# Patient Record
Sex: Male | Born: 1960
Health system: Southern US, Community
[De-identification: ages and names within clinical notes are randomized; demographics above are authoritative.]

## PROBLEM LIST (undated history)

## (undated) DIAGNOSIS — Z0000 Encounter for general adult medical examination without abnormal findings: Secondary | ICD-10-CM

## (undated) DIAGNOSIS — I10 Essential (primary) hypertension: Secondary | ICD-10-CM

## (undated) DIAGNOSIS — I82409 Acute embolism and thrombosis of unspecified deep veins of unspecified lower extremity: Secondary | ICD-10-CM

## (undated) DIAGNOSIS — R002 Palpitations: Secondary | ICD-10-CM

## (undated) DIAGNOSIS — E119 Type 2 diabetes mellitus without complications: Secondary | ICD-10-CM

## (undated) DIAGNOSIS — G4730 Sleep apnea, unspecified: Secondary | ICD-10-CM

## (undated) DIAGNOSIS — E785 Hyperlipidemia, unspecified: Secondary | ICD-10-CM

## (undated) DIAGNOSIS — E669 Obesity, unspecified: Secondary | ICD-10-CM

## (undated) HISTORY — DX: Hyperlipidemia, unspecified: E78.5

## (undated) HISTORY — DX: Encounter for general adult medical examination without abnormal findings: Z00.00

## (undated) HISTORY — DX: Sleep apnea, unspecified: G47.30

## (undated) HISTORY — PX: TONSILLECTOMY AND ADENOIDECTOMY: SUR1326

## (undated) HISTORY — DX: Type 2 diabetes mellitus without complications: E11.9

## (undated) HISTORY — DX: Acute embolism and thrombosis of unspecified deep veins of unspecified lower extremity: I82.409

## (undated) HISTORY — DX: Essential (primary) hypertension: I10

## (undated) HISTORY — DX: Obesity, unspecified: E66.9

## (undated) HISTORY — DX: Palpitations: R00.2

## (undated) HISTORY — PX: OTHER SURGICAL HISTORY: SHX169

---

## 2009-04-22 DEATH — deceased

## 2014-07-03 ENCOUNTER — Encounter: Payer: Self-pay | Admitting: *Deleted

## 2014-07-07 ENCOUNTER — Ambulatory Visit (INDEPENDENT_AMBULATORY_CARE_PROVIDER_SITE_OTHER): Payer: 59 | Admitting: Cardiology

## 2014-07-07 ENCOUNTER — Encounter: Payer: Self-pay | Admitting: Cardiology

## 2014-07-07 VITALS — BP 147/87 | Ht 72.0 in | Wt 318.0 lb

## 2014-07-07 DIAGNOSIS — R002 Palpitations: Secondary | ICD-10-CM

## 2014-07-07 DIAGNOSIS — I82409 Acute embolism and thrombosis of unspecified deep veins of unspecified lower extremity: Secondary | ICD-10-CM | POA: Insufficient documentation

## 2014-07-07 DIAGNOSIS — Z86718 Personal history of other venous thrombosis and embolism: Secondary | ICD-10-CM

## 2014-07-07 DIAGNOSIS — R0602 Shortness of breath: Secondary | ICD-10-CM

## 2014-07-07 DIAGNOSIS — O2230 Deep phlebothrombosis in pregnancy, unspecified trimester: Secondary | ICD-10-CM

## 2014-07-07 NOTE — Patient Instructions (Addendum)
Your physician recommends that you schedule a follow-up appointment in: one year with Dr. Percival Spanish  We are ordering a Calcium score  We are ordering a stress test

## 2014-07-07 NOTE — Progress Notes (Signed)
HPI The patient presents for evaluation of palpitations. He has a history of DVT and pulmonary emboli and has been on chronic anticoagulation therapy. This happened when he lived in [place] and I don't have these records. However, fairly recently he had some increased palpitations. This seemed to have been with after he hasn't caffeine including some energy drinks. He felt his heart skipping one evening and then through the day. He did go to see his primary care doctor and there is mention of ectopic beats. However, he denies any ongoing symptoms since resolved. He does otherwise have rare palpitations but no presyncope or syncope. He denies any chest pressure, neck or arm discomfort. He denies any shortness of breath, PND or orthopnea. He has started doing some exercise very recently just had one session and plans to take this up.  Not on File  Current Outpatient Prescriptions  Medication Sig Dispense Refill  . atorvastatin (LIPITOR) 20 MG tablet Take 20 mg by mouth daily.      Marland Kitchen lisinopril (PRINIVIL,ZESTRIL) 20 MG tablet Take 20 mg by mouth daily.      . metformin (FORTAMET) 1000 MG (OSM) 24 hr tablet Take 1,000 mg by mouth daily with breakfast.      . metoprolol succinate (TOPROL-XL) 25 MG 24 hr tablet Take 25 mg by mouth daily.      . rivaroxaban (XARELTO) 20 MG TABS tablet Take 20 mg by mouth daily with supper.       No current facility-administered medications for this visit.    Past Medical History  Diagnosis Date  . DVT (deep vein thrombosis) in pregnancy   . PE (physical exam), annual   . Palpitation   . Sleep apnea     CPAP  . Hyperlipidemia   . HTN (hypertension)   . Obesity   . Diabetes     Past Surgical History  Procedure Laterality Date  . Tonsillectomy and adenoidectomy    . Athroscopic      Knee (right)    Family History  Problem Relation Age of Onset  . Clotting disorder Father   . Hypertension Mother     History   Social History  . Marital  Status: Married    Spouse Name: N/A    Number of Children: 6  . Years of Education: N/A   Occupational History  . Not on file.   Social History Main Topics  . Smoking status: Former Smoker -- 0.50 packs/day for 10 years    Types: Cigarettes  . Smokeless tobacco: Former Systems developer    Quit date: 06/22/2013  . Alcohol Use: Not on file  . Drug Use: Not on file  . Sexual Activity: Not on file   Other Topics Concern  . 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      ROS:  As stated in the HPI and negative for all other systems.  PHYSICAL EXAM BP 147/87  Ht 6' (1.829 m)  Wt 318 lb (144.244 kg)  BMI 43.12 kg/m2 GENERAL:  Well appearing HEENT:  Pupils equal round and reactive, fundi not visualized, oral mucosa unremarkable NECK:  No jugular venous distention, waveform within normal limits, carotid upstroke brisk and symmetric, no bruits, no thyromegaly LYMPHATICS:  No cervical, inguinal adenopathy LUNGS:  Clear to auscultation bilaterally BACK:  No CVA tenderness CHEST:  Unremarkable HEART:  PMI not displaced or sustained,S1 and S2 within normal limits, no S3, no  S4, no clicks, no rubs, no murmurs ABD:  Flat, positive bowel sounds normal in frequency in pitch, no bruits, no rebound, no guarding, no midline pulsatile mass, no hepatomegaly, no splenomegaly EXT:  2 plus pulses throughout, no edema, no cyanosis no clubbing SKIN:  No rashes no nodules NEURO:  Cranial nerves II through XII grossly intact, motor grossly intact throughout PSYCH:  Cognitively intact, oriented to person place and time   EKG:  Sinus rhythm, rate 72, axis within normal limits, intervals within normal limits, no acute ST-T wave changes.  07/07/2014   ASSESSMENT AND PLAN  PALPITATIONS:   These are no longer problematic. There was some mention of premature atrial contractions. However, his severe events were probably related to excessive caffeine. No change in  therapy or further evaluation specifically for this is indicated unless he has recurrent symptoms.  SCREENING:  With a long discussion about his risk of cardiovascular disease and multiple cardiovascular risk factors. He's about to start on an exercise regimen. Given this and his diabetes I do plan on screening him.  I will bring the patient back for a POET (Plain Old Exercise Test). This will allow me to screen for obstructive coronary disease, risk stratify and very importantly provide a prescription for exercise.  I have also suggested a coronary calcium score.  OBESITY:  The patient understands the need to lose weight with diet and exercise. We have discussed specific strategies for this.  DM:  Per  Melinda Crutch, MD  HYPERLIPIDEMIA:  His LDL was near 70 although his HDL was low and triglycerides elevated. I rviewed these results. For now he will continue on the meds as listed.  However, we might pursue more aggressive screening and treatment based on the results of his above testing.  CHRONIC ANTICOAGULATION:  I discussed this with him and he'll have the details around his clotting events. I don't note he's ever had a hypercoagulable workup. I would be happy to review these data suggest to get the records from [place].

## 2014-07-13 ENCOUNTER — Ambulatory Visit (INDEPENDENT_AMBULATORY_CARE_PROVIDER_SITE_OTHER)
Admission: RE | Admit: 2014-07-13 | Discharge: 2014-07-13 | Disposition: A | Discharge status: Home / Self Care | Payer: Self-pay | Source: Ambulatory Visit | Attending: Cardiology | Admitting: Cardiology

## 2014-07-13 DIAGNOSIS — O2230 Deep phlebothrombosis in pregnancy, unspecified trimester: Secondary | ICD-10-CM

## 2014-07-13 DIAGNOSIS — R0602 Shortness of breath: Secondary | ICD-10-CM

## 2014-07-13 DIAGNOSIS — R002 Palpitations: Secondary | ICD-10-CM

## 2014-07-17 ENCOUNTER — Ambulatory Visit: Payer: Self-pay | Admitting: Cardiology

## 2014-08-02 ENCOUNTER — Telehealth (HOSPITAL_COMMUNITY): Payer: Self-pay

## 2014-08-02 NOTE — Telephone Encounter (Signed)
Encounter complete. 

## 2014-08-04 ENCOUNTER — Ambulatory Visit (HOSPITAL_COMMUNITY)
Admission: RE | Admit: 2014-08-04 | Discharge: 2014-08-04 | Disposition: A | Discharge status: Home / Self Care | Payer: 59 | Source: Ambulatory Visit | Attending: Internal Medicine | Admitting: Internal Medicine

## 2014-08-04 DIAGNOSIS — R0602 Shortness of breath: Secondary | ICD-10-CM | POA: Diagnosis not present

## 2014-08-04 DIAGNOSIS — O2230 Deep phlebothrombosis in pregnancy, unspecified trimester: Secondary | ICD-10-CM

## 2014-08-04 DIAGNOSIS — R002 Palpitations: Secondary | ICD-10-CM | POA: Diagnosis present

## 2014-08-04 NOTE — Procedures (Signed)
Exercise Treadmill Test Test  Exercise Tolerance Test Ordering MD: Marijo File, MD  Interpreting MD:   Unique Test No:1  Treadmill:  1  Indication for ETT: dyspnea, palpitations Contraindication to ETT: No   Stress Modality: exercise - treadmill  Cardiac Imaging Performed: non   Protocol: standard Bruce - maximal  Max BP:  214/66  Max MPHR (bpm):  168 85% MPR (bpm): 142  MPHR obtained (bpm): 164 % MPHR obtained:  97  Reached 85% MPHR (min:sec):  4:55 Total Exercise Time (min-sec):  6:47  Workload in METS: 8.20 Borg Scale:   Reason ETT Terminated:  Dyspnea, fatigue    ST Segment Analysis At Rest: NSR, nonspecific ST changes With Exercise: no evidence of significant ST depression  Other Information Arrhythmia:  Rare PVC Angina during ETT:  absent (0) Quality of ETT:  diagnostic  ETT Interpretation:  normal - no evidence of ischemia by ST analysis  Comments: ETT with fair exercise tolerance (6:47); Normal BP response; no chest pain; no diagnostic ST changes; negative adequate ETT.   Kirk Ruths

## 2015-07-11 ENCOUNTER — Ambulatory Visit: Payer: Self-pay | Admitting: Physician Assistant

## 2015-07-11 DIAGNOSIS — R7989 Other specified abnormal findings of blood chemistry: Secondary | ICD-10-CM

## 2015-07-11 MED ORDER — TESTOSTERONE CYPIONATE 200 MG/ML IM SOLN
200.0000 mg | INTRAMUSCULAR | Status: AC
  Administered 2015-07-11: 200 mg via INTRAMUSCULAR

## 2015-07-11 NOTE — Progress Notes (Signed)
Here for regularly scheduled testosterone injection

## 2015-10-16 MED FILL — LISINOPRIL 20 MG TABLET: 20 | 90 days supply | Qty: 90 | Fill #3

## 2015-10-16 MED FILL — metFORMIN HCL 1000 MG TABS: 1000 | 90 days supply | Qty: 90 | Fill #3

## 2015-10-16 MED FILL — METOPROLOL SUCC ER 25 MG TA: 25 | 90 days supply | Qty: 90 | Fill #3

## 2015-10-16 MED FILL — XARELTO 20 MG TABLET: 20 | 90 days supply | Qty: 90 | Fill #3

## 2015-10-16 MED FILL — ATORVASTATIN 20 MG TABLET: 20 | 90 days supply | Qty: 90 | Fill #3

## 2015-10-25 DIAGNOSIS — G4733 Obstructive sleep apnea (adult) (pediatric): Secondary | ICD-10-CM | POA: Diagnosis not present

## 2015-12-20 DIAGNOSIS — E7800 Pure hypercholesterolemia, unspecified: Secondary | ICD-10-CM | POA: Diagnosis not present

## 2015-12-20 DIAGNOSIS — Z0000 Encounter for general adult medical examination without abnormal findings: Secondary | ICD-10-CM | POA: Diagnosis not present

## 2015-12-20 DIAGNOSIS — E119 Type 2 diabetes mellitus without complications: Secondary | ICD-10-CM | POA: Diagnosis not present

## 2015-12-20 DIAGNOSIS — I10 Essential (primary) hypertension: Secondary | ICD-10-CM | POA: Diagnosis not present

## 2015-12-20 DIAGNOSIS — Z86711 Personal history of pulmonary embolism: Secondary | ICD-10-CM | POA: Diagnosis not present

## 2015-12-20 DIAGNOSIS — Z23 Encounter for immunization: Secondary | ICD-10-CM | POA: Diagnosis not present

## 2015-12-20 MED FILL — SILDENAFIL 20 MG TABLET: 20 | 10 days supply | Qty: 50 | Fill #0

## 2016-01-10 DIAGNOSIS — R05 Cough: Secondary | ICD-10-CM | POA: Diagnosis not present

## 2016-01-10 MED FILL — VENTOLIN HFA 90 MCG INHALER: 108 (90 BAS | 17 days supply | Qty: 18 | Fill #0

## 2016-01-10 MED FILL — PROMETHAZINE-CODEINE SYRUP: 6.25-10 | 5 days supply | Qty: 100 | Fill #0

## 2016-01-10 MED FILL — BENZONATATE 100 MG CAPSULE: 100 | 10 days supply | Qty: 30 | Fill #0

## 2016-01-21 MED FILL — METOPROLOL SUCC ER 25 MG TA: 25 | 90 days supply | Qty: 90 | Fill #0

## 2016-01-21 MED FILL — metFORMIN HCL 1000 MG TABS: 1000 | 90 days supply | Qty: 90 | Fill #0

## 2016-01-21 MED FILL — XARELTO 20 MG TABLET: 20 | 90 days supply | Qty: 90 | Fill #0

## 2016-01-21 MED FILL — ATORVASTATIN 20 MG TABLET: 20 | 90 days supply | Qty: 90 | Fill #0

## 2016-01-21 MED FILL — LISINOPRIL 20 MG TABLET: 20 | 90 days supply | Qty: 90 | Fill #0

## 2016-03-13 DIAGNOSIS — G4733 Obstructive sleep apnea (adult) (pediatric): Secondary | ICD-10-CM | POA: Diagnosis not present

## 2016-04-09 DIAGNOSIS — L237 Allergic contact dermatitis due to plants, except food: Secondary | ICD-10-CM | POA: Diagnosis not present

## 2016-04-09 MED FILL — predniSONE 10 MG TABS: 10 | 6 days supply | Qty: 21 | Fill #0

## 2016-04-22 MED FILL — metFORMIN HCL 1000 MG TABS: 1000 | 90 days supply | Qty: 90 | Fill #1

## 2016-04-22 MED FILL — METOPROLOL SUCC ER 25 MG TA: 25 | 90 days supply | Qty: 90 | Fill #1

## 2016-04-22 MED FILL — ATORVASTATIN 20 MG TABLET: 20 | 90 days supply | Qty: 90 | Fill #1

## 2016-04-22 MED FILL — XARELTO 20 MG TABLET: 20 | 90 days supply | Qty: 90 | Fill #1

## 2016-04-22 MED FILL — LISINOPRIL 20 MG TABLET: 20 | 90 days supply | Qty: 90 | Fill #1

## 2016-04-24 DIAGNOSIS — G4733 Obstructive sleep apnea (adult) (pediatric): Secondary | ICD-10-CM | POA: Diagnosis not present

## 2016-06-24 DIAGNOSIS — Z7984 Long term (current) use of oral hypoglycemic drugs: Secondary | ICD-10-CM | POA: Diagnosis not present

## 2016-06-24 DIAGNOSIS — Z6841 Body Mass Index (BMI) 40.0 and over, adult: Secondary | ICD-10-CM | POA: Diagnosis not present

## 2016-06-24 DIAGNOSIS — E119 Type 2 diabetes mellitus without complications: Secondary | ICD-10-CM | POA: Diagnosis not present

## 2016-06-24 DIAGNOSIS — Z23 Encounter for immunization: Secondary | ICD-10-CM | POA: Diagnosis not present

## 2016-07-23 MED FILL — LISINOPRIL 20 MG TABLET: 20 | 90 days supply | Qty: 90 | Fill #2

## 2016-07-23 MED FILL — metFORMIN HCL 1000 MG TABS: 1000 | 90 days supply | Qty: 90 | Fill #2

## 2016-07-23 MED FILL — XARELTO 20 MG TABLET: 20 | 90 days supply | Qty: 90 | Fill #2

## 2016-07-23 MED FILL — METOPROLOL SUCC ER 25 MG TA: 25 | 90 days supply | Qty: 90 | Fill #2

## 2016-07-23 MED FILL — ATORVASTATIN 20 MG TABLET: 20 | 90 days supply | Qty: 90 | Fill #2

## 2016-08-04 DIAGNOSIS — G4733 Obstructive sleep apnea (adult) (pediatric): Secondary | ICD-10-CM | POA: Diagnosis not present

## 2016-10-24 MED FILL — METOPROLOL SUCC ER 25 MG TA: 25 | 90 days supply | Qty: 90 | Fill #3

## 2016-10-24 MED FILL — XARELTO 20 MG TABLET: 20 | 90 days supply | Qty: 90 | Fill #3

## 2016-10-24 MED FILL — LISINOPRIL 20 MG TABLET: 20 | 90 days supply | Qty: 90 | Fill #3

## 2016-10-24 MED FILL — metFORMIN HCL 1000 MG TABS: 1000 | 90 days supply | Qty: 90 | Fill #3

## 2016-10-24 MED FILL — ATORVASTATIN 20 MG TABLET: 20 | 90 days supply | Qty: 90 | Fill #3

## 2016-10-24 MED FILL — SILDENAFIL 20 MG TABLET: 20 | 6 days supply | Qty: 30 | Fill #1

## 2016-12-16 MED FILL — CLINDAMYCIN HCL 150 MG CAPS: 150 | 7 days supply | Qty: 28 | Fill #0

## 2016-12-25 DIAGNOSIS — E119 Type 2 diabetes mellitus without complications: Secondary | ICD-10-CM | POA: Diagnosis not present

## 2016-12-25 DIAGNOSIS — Z7984 Long term (current) use of oral hypoglycemic drugs: Secondary | ICD-10-CM | POA: Diagnosis not present

## 2016-12-25 DIAGNOSIS — M7710 Lateral epicondylitis, unspecified elbow: Secondary | ICD-10-CM | POA: Diagnosis not present

## 2016-12-25 DIAGNOSIS — I10 Essential (primary) hypertension: Secondary | ICD-10-CM | POA: Diagnosis not present

## 2017-01-26 MED FILL — XARELTO 20 MG TABLET: 20 | 90 days supply | Qty: 90 | Fill #0

## 2017-01-26 MED FILL — ATORVASTATIN 20 MG TABLET: 20 | 90 days supply | Qty: 90 | Fill #0

## 2017-01-26 MED FILL — LISINOPRIL 20 MG TABLET: 20 | 90 days supply | Qty: 90 | Fill #0

## 2017-01-26 MED FILL — METOPROLOL SUCC ER 25 MG TA: 25 | 90 days supply | Qty: 90 | Fill #0

## 2017-01-26 MED FILL — metFORMIN HCL 1000 MG TABS: 1000 | 90 days supply | Qty: 90 | Fill #0

## 2017-03-13 DIAGNOSIS — H52221 Regular astigmatism, right eye: Secondary | ICD-10-CM | POA: Diagnosis not present

## 2017-03-13 DIAGNOSIS — H524 Presbyopia: Secondary | ICD-10-CM | POA: Diagnosis not present

## 2017-03-13 DIAGNOSIS — H5213 Myopia, bilateral: Secondary | ICD-10-CM | POA: Diagnosis not present

## 2017-03-13 DIAGNOSIS — Z7984 Long term (current) use of oral hypoglycemic drugs: Secondary | ICD-10-CM | POA: Diagnosis not present

## 2017-03-13 DIAGNOSIS — E119 Type 2 diabetes mellitus without complications: Secondary | ICD-10-CM | POA: Diagnosis not present

## 2017-04-15 DIAGNOSIS — G4733 Obstructive sleep apnea (adult) (pediatric): Secondary | ICD-10-CM | POA: Diagnosis not present

## 2017-04-21 MED FILL — FLUTICASONE PROP 0.05% CRM: 0.05 | 30 days supply | Qty: 30 | Fill #0

## 2017-04-21 MED FILL — metFORMIN HCL 1000 MG TABS: 1000 | 90 days supply | Qty: 90 | Fill #1

## 2017-04-21 MED FILL — METOPROLOL SUCC ER 25 MG TA: 25 | 90 days supply | Qty: 90 | Fill #0

## 2017-04-21 MED FILL — SILDENAFIL 20 MG TABLET: 20 | 10 days supply | Qty: 50 | Fill #0

## 2017-04-21 MED FILL — ATORVASTATIN 20 MG TABLET: 20 | 90 days supply | Qty: 90 | Fill #0

## 2017-04-21 MED FILL — XARELTO 20 MG TABLET: 20 | 90 days supply | Qty: 90 | Fill #0

## 2017-04-21 MED FILL — LISINOPRIL 20 MG TAB: 20 | 90 days supply | Qty: 90 | Fill #1

## 2017-04-23 ENCOUNTER — Telehealth: Payer: Self-pay | Admitting: Cardiology

## 2017-04-23 NOTE — Telephone Encounter (Signed)
New message       Pt c/o Shortness Of Breath: STAT if SOB developed within the last 24 hours or pt is noticeably SOB on the phone  1. Are you currently SOB (can you hear that pt is SOB on the phone)?  no 2. How long have you been experiencing SOB?  approx 1 month 3. Are you SOB when sitting or when up moving around?  Moving around  4. Are you currently experiencing any other symptoms?  Irregular heart beat, extreme fatigue and tingling in extremities.  Appt was made on 05-05-17 with Almyra Deforest ( no appts available in care group) but, because of symptoms, please call pt and see if he needs to be seen sooner.  He does not feel good.

## 2017-04-23 NOTE — Telephone Encounter (Signed)
Returned call to patient.Stated for the past 1 month he tires easy,sob with least exertion,frequent palpitations.Stated he has appointment with Dr.Hochrein 05/05/17, but wife who is a cardiac nurse at [employer] thinks he needs to be seen sooner.Stated he wants to see Dr.Hochrein.Advised Dr.Hochrein's schedule is full.I will send message to him for advice.

## 2017-04-26 NOTE — Telephone Encounter (Signed)
I could see this patient on Tuesday afternoon.

## 2017-04-27 NOTE — Telephone Encounter (Signed)
appt schedule for 08/07 @2 :00 pm

## 2017-04-27 NOTE — Progress Notes (Signed)
Cardiology Office Note   Date:  04/29/2017   ID:  Ronald Woodward, DOB December 24, 1960, MRN [id number]  PCP:  [name], MD  Cardiologist:   Minus Breeding, MD    Chief Complaint  Patient presents with  . Palpitations  . Shortness of Breath      History of Present Illness: Ronald Woodward is a 56 y.o. male who presents for evaluation of SOB, palpitations and fatigue.  I saw him in 2015.  he had some similar symptoms and had a zero calcium score and negative POET (Plain Old Exercise Treadmill)   He says about a month ago he started having increased palpitations. There was one day when he had about 10 of these in a span of 5 minutes. He describes what sounds like more isolated skipped beats similar to PVCs he's had in the past. He has had some episodes of almost feeling like he was going to pass out with this. He's having these episodes times a week though not as severe typically. He's had more shortness of breath with activity such as walking a moderate distance on level ground. He's had some dyspnea walking up hills. He's not having any chest pressure, neck or arm discomfort. He's not having any resting PND or orthopnea. He uses his CPAP. He got some of the symptoms were similar to prior to his DVT and coronary embolism although he's been on chronic anticoagulation. He's had no leg swelling.   Past Medical History:  Diagnosis Date  . Diabetes ([place])   . DVT (deep venous thrombosis) ([place])   . HTN (hypertension)   . Hyperlipidemia   . Obesity   . Palpitation   . PE (physical exam), annual   . Sleep apnea    CPAP    Past Surgical History:  Procedure Laterality Date  . Athroscopic     Knee (right)  . TONSILLECTOMY AND ADENOIDECTOMY       Current Outpatient Prescriptions  Medication Sig Dispense Refill  . atorvastatin (LIPITOR) 20 MG tablet Take 20 mg by mouth daily.    Marland Kitchen lisinopril (PRINIVIL,ZESTRIL) 20 MG tablet Take 20 mg by mouth daily.    . metformin (FORTAMET)  1000 MG (OSM) 24 hr tablet Take 1,000 mg by mouth daily with breakfast.    . metoprolol succinate (TOPROL-XL) 50 MG 24 hr tablet Take 1 tablet (50 mg total) by mouth daily. 90 tablet 3  . rivaroxaban (XARELTO) 20 MG TABS tablet Take 20 mg by mouth daily with supper.    . chlorthalidone (HYGROTON) 25 MG tablet Take 1 tablet (25 mg total) by mouth daily. 90 tablet 3   Current Facility-Administered Medications  Medication Dose Route Frequency Provider Last Rate Last Dose  . testosterone cypionate (DEPOTESTOSTERONE CYPIONATE) injection 200 mg  200 mg Intramuscular Q14 Days Versie Starks, PA-C   200 mg at 07/11/15 1206    Allergies:   Patient has no allergy information on record.     ROS:  Please see the history of present illness.   Otherwise, review of systems are positive for none.   All other systems are reviewed and negative.    PHYSICAL EXAM: VS:  BP (!) 153/85   Pulse 74   Ht 6\' 1"  (1.854 m)   Wt (!) 317 lb (143.8 kg)   BMI 41.82 kg/m  , BMI Body mass index is 41.82 kg/m. GENERAL:  Well appearing HEENT:  Pupils equal round and reactive, fundi not visualized, oral mucosa unremarkable NECK:  No jugular venous distention, waveform within normal limits, carotid upstroke brisk and symmetric, no bruits, no thyromegaly LYMPHATICS:  No cervical, inguinal adenopathy LUNGS:  Clear to auscultation bilaterally BACK:  No CVA tenderness CHEST:  Unremarkable HEART:  PMI not displaced or sustained,S1 and S2 within normal limits, no S3, no S4, no clicks, no rubs, no murmurs ABD:  Flat, positive bowel sounds normal in frequency in pitch, no bruits, no rebound, no guarding, no midline pulsatile mass, no hepatomegaly, no splenomegaly EXT:  2 plus pulses throughout, no edema, no cyanosis no clubbing SKIN:  No rashes no nodules NEURO:  Cranial nerves II through XII grossly intact, motor grossly intact throughout PSYCH:  Cognitively intact, oriented to person place and time    EKG:  EKG is  ordered today. The ekg ordered today demonstrates sinus rhythm, rate 74, axis within normal limits intervals within normal limits, no acute ST changes.   Recent Labs: No results found for requested labs within last 8760 hours.    Lipid Panel No results found for: CHOL, TRIG, HDL, CHOLHDL, VLDL, LDLCALC, LDLDIRECT    Wt Readings from Last 3 Encounters:  04/28/17 (!) 317 lb (143.8 kg)  07/07/14 (!) 318 lb (144.2 kg)      Other studies Reviewed: Additional studies/ records that were reviewed today include: None. Review of the above records demonstrates:  Please see elsewhere in the note.     ASSESSMENT AND PLAN:   PALPITATIONS:    I'm going to have him increase his metoprolol to 50 mg daily. We'll see if this helps with his palpitations as well as blood pressure.  OBESITY:   I have prescribed the Mediterranean diet. We talked about increased physical activity as well.  DM:   he says his last hemoglobin A1c was 6.1. I'll defer management to  Melinda Crutch, MD  CHRONIC ANTICOAGULATION:   He had an unprovoked DVT in the past and remains on chronic anticoagulation.  DYSPNEA:  I will check a d-dimer and a BNP level. If these are normal I would see with diet and exercise. He had a 0 coronary calcium score in the past less than 5 years ago and I think the pretest probability of obstructive coronary disease as an etiology is low.  HTN: I'm going to add chlorthalidone 25 mg daily to his medical regimen. We might in the future because of back off on his medicines with weight loss and physical activity.     Current medicines are reviewed at length with the patient today.  The patient does not have concerns regarding medicines.  The following changes have been made:  no change  Labs/ tests ordered today include:   Orders Placed This Encounter  Procedures  . D-Dimer, Quantitative  . B Nat Peptide  . EKG 12-Lead     Disposition:   FU with me in 3 months or so.       Signed, Minus Breeding, MD  04/29/2017 8:58 AM    [facility]

## 2017-04-28 ENCOUNTER — Encounter: Payer: Self-pay | Admitting: Cardiology

## 2017-04-28 ENCOUNTER — Ambulatory Visit (INDEPENDENT_AMBULATORY_CARE_PROVIDER_SITE_OTHER): Payer: 59 | Admitting: Cardiology

## 2017-04-28 VITALS — BP 153/85 | HR 74 | Ht 73.0 in | Wt 317.0 lb

## 2017-04-28 DIAGNOSIS — R002 Palpitations: Secondary | ICD-10-CM | POA: Diagnosis not present

## 2017-04-28 DIAGNOSIS — R0602 Shortness of breath: Secondary | ICD-10-CM | POA: Diagnosis not present

## 2017-04-28 DIAGNOSIS — I10 Essential (primary) hypertension: Secondary | ICD-10-CM

## 2017-04-28 MED ORDER — CHLORTHALIDONE 25 MG PO TABS: 25.0000 mg | ORAL_TABLET | Freq: Every day | ORAL | 3 refills | Status: DC

## 2017-04-28 MED ORDER — METOPROLOL SUCCINATE ER 50 MG PO TB24: 50.0000 mg | ORAL_TABLET | Freq: Every day | ORAL | 3 refills | Status: DC

## 2017-04-28 MED FILL — CHLORTHALIDONE 25 MG TABLET: 25 | 90 days supply | Qty: 90 | Fill #0

## 2017-04-28 MED FILL — METOPROLOL SUCC ER 50 MG TA: 50 | 90 days supply | Qty: 90 | Fill #0

## 2017-04-28 NOTE — Patient Instructions (Signed)
Medication Instructions:  START- Chlorthalidone 25 mg daily INCREASE- Metoprolol 50 mg daily  If you need a refill on your cardiac medications before your next appointment, please call your pharmacy.  Labwork: D-Dimer and BNP HERE IN OUR OFFICE AT [facility]  Testing/Procedures: None Ordered  Follow-Up: Your physician wants you to follow-up in: 3 Months.    Thank you for choosing [facility] at [facility]!!

## 2017-04-29 ENCOUNTER — Encounter: Payer: Self-pay | Admitting: Cardiology

## 2017-04-29 DIAGNOSIS — R0602 Shortness of breath: Secondary | ICD-10-CM | POA: Insufficient documentation

## 2017-04-29 LAB — D-DIMER, QUANTITATIVE (NOT AT ARMC): D-DIMER: 0.43 mg{FEU}/L (ref 0.00–0.49)

## 2017-04-29 LAB — BRAIN NATRIURETIC PEPTIDE: BNP: 11.3 pg/mL (ref 0.0–100.0)

## 2017-05-05 ENCOUNTER — Ambulatory Visit: Payer: Self-pay | Admitting: Physician Assistant

## 2017-05-13 DIAGNOSIS — M1711 Unilateral primary osteoarthritis, right knee: Secondary | ICD-10-CM | POA: Diagnosis not present

## 2017-06-12 MED FILL — SILDENAFIL 20 MG TABLET: 20 | 10 days supply | Qty: 50 | Fill #1

## 2017-07-01 DIAGNOSIS — E119 Type 2 diabetes mellitus without complications: Secondary | ICD-10-CM | POA: Diagnosis not present

## 2017-07-01 DIAGNOSIS — Z7984 Long term (current) use of oral hypoglycemic drugs: Secondary | ICD-10-CM | POA: Diagnosis not present

## 2017-07-01 DIAGNOSIS — Z23 Encounter for immunization: Secondary | ICD-10-CM | POA: Diagnosis not present

## 2017-07-01 DIAGNOSIS — L237 Allergic contact dermatitis due to plants, except food: Secondary | ICD-10-CM | POA: Diagnosis not present

## 2017-07-01 DIAGNOSIS — Z6841 Body Mass Index (BMI) 40.0 and over, adult: Secondary | ICD-10-CM | POA: Diagnosis not present

## 2017-07-01 MED FILL — TRIAMCINOLONE 0.1% CREAM: 0.1 | 10 days supply | Qty: 80 | Fill #0

## 2017-07-14 ENCOUNTER — Encounter: Payer: Self-pay | Admitting: Cardiology

## 2017-07-23 DIAGNOSIS — G4733 Obstructive sleep apnea (adult) (pediatric): Secondary | ICD-10-CM | POA: Diagnosis not present

## 2017-07-27 MED FILL — XARELTO 20 MG TABLET: 20 | 90 days supply | Qty: 90 | Fill #0

## 2017-07-27 MED FILL — metFORMIN HCL 1000 MG TABS: 1000 | 90 days supply | Qty: 90 | Fill #2

## 2017-07-27 MED FILL — SILDENAFIL 20 MG TABLET: 20 | 10 days supply | Qty: 50 | Fill #2

## 2017-07-27 MED FILL — CHLORTHALIDONE 25 MG TABLET: 25 | 90 days supply | Qty: 90 | Fill #1

## 2017-07-27 MED FILL — LISINOPRIL 20 MG TABS: 20 | 90 days supply | Qty: 90 | Fill #2

## 2017-07-27 MED FILL — ATORVASTATIN 20 MG TABLET: 20 | 90 days supply | Qty: 90 | Fill #0

## 2017-07-27 MED FILL — METOPROLOL SUCC ER 50 MG TA: 50 | 90 days supply | Qty: 90 | Fill #1

## 2017-07-29 NOTE — Progress Notes (Signed)
Cardiology Office Note   Date:  07/30/2017   ID:  ARPAN ESKELSON, DOB 12-01-1960, MRN [id number]  PCP:  [name], MD  Cardiologist:   Minus Breeding, MD    No chief complaint on file.     History of Present Illness: Ronald Woodward is a 56 y.o. male who presents for evaluation of SOB, palpitations and fatigue.  I saw him in 2015.  he had some similar symptoms and had a zero calcium score and negative POET (Plain Old Exercise Treadmill)     I saw him this summer and he had more palpitations.  At the last visit I increased his beta blocker. Since then he has had fewer palpitations.  The patient denies any new symptoms such as chest discomfort, neck or arm discomfort. There has been no new shortness of breath, PND or orthopnea. There have been no reported palpitations, presyncope or syncope.  He is not exercising as much as he should although he does walk 7000 steps at [facility].     Past Medical History:  Diagnosis Date  . Diabetes ([place])   . DVT (deep venous thrombosis) ([place])   . HTN (hypertension)   . Hyperlipidemia   . Obesity   . Palpitation   . PE (physical exam), annual   . Sleep apnea    CPAP    Past Surgical History:  Procedure Laterality Date  . Athroscopic     Knee (right)  . TONSILLECTOMY AND ADENOIDECTOMY       Current Outpatient Medications  Medication Sig Dispense Refill  . atorvastatin (LIPITOR) 20 MG tablet Take 20 mg by mouth daily.    . chlorthalidone (HYGROTON) 25 MG tablet Take 1 tablet (25 mg total) by mouth daily. 90 tablet 3  . lisinopril (PRINIVIL,ZESTRIL) 20 MG tablet Take 20 mg by mouth daily.    . metformin (FORTAMET) 1000 MG (OSM) 24 hr tablet Take 1,000 mg by mouth daily with breakfast.    . metoprolol succinate (TOPROL-XL) 50 MG 24 hr tablet Take 1 tablet (50 mg total) by mouth daily. 90 tablet 3  . rivaroxaban (XARELTO) 20 MG TABS tablet Take 20 mg by mouth daily with supper.     Current Facility-Administered Medications   Medication Dose Route Frequency Provider Last Rate Last Dose  . testosterone cypionate (DEPOTESTOSTERONE CYPIONATE) injection 200 mg  200 mg Intramuscular Q14 Days Versie Starks, PA-C   200 mg at 07/11/15 1206    Allergies:   Patient has no allergy information on record.     ROS:  Please see the history of present illness.   Otherwise, review of systems are positive for depression.   All other systems are reviewed and negative.    PHYSICAL EXAM: VS:  BP 110/80   Pulse 77   Ht 6\' 1"  (1.854 m)   Wt (!) 316 lb (143.3 kg)   SpO2 95%   BMI 41.69 kg/m  , BMI Body mass index is 41.69 kg/m.  GENERAL:  Well appearing NECK:  No jugular venous distention, waveform within normal limits, carotid upstroke brisk and symmetric, no bruits, no thyromegaly LUNGS:  Clear to auscultation bilaterally CHEST:  Unremarkable HEART:  PMI not displaced or sustained,S1 and S2 within normal limits, no S3, no S4, no clicks, no rubs, no murmurs ABD:  Flat, positive bowel sounds normal in frequency in pitch, no bruits, no rebound, no guarding, no midline pulsatile mass, no hepatomegaly, no splenomegaly EXT:  2 plus pulses throughout, no  edema, no cyanosis no clubbing    EKG:  EKG is not ordered today. .   Recent Labs: 04/28/2017: BNP 11.3    Lipid Panel No results found for: CHOL, TRIG, HDL, CHOLHDL, VLDL, LDLCALC, LDLDIRECT    Wt Readings from Last 3 Encounters:  07/30/17 (!) 316 lb (143.3 kg)  04/28/17 (!) 317 lb (143.8 kg)  07/07/14 (!) 318 lb (144.2 kg)      Other studies Reviewed: Additional studies/ records that were reviewed today include: None. Review of the above records demonstrates:  Please see elsewhere in the note.     ASSESSMENT AND PLAN:   PALPITATIONS:    These are not as problematic as previously.  No change in therapy is indicated.  OBESITY:   We had a long discussion about this.  We talked specifically about exercise today.  Previously I have prescribed the  Mediterranean diet.  DM:   He says his last hemoglobin A1c was 6.1. I will defer to [name], MD  CHRONIC ANTICOAGULATION:  He remains on anticoagulation for an unprovoked DVT.   DYSPNEA:    BNP and D dimer were negative recently.  No further work up is planned. n etiology is low.  HTN:   At the last visit I added chlorthalidone.  His BP is well controlled.  He will continue with meds as listed.    Current medicines are reviewed at length with the patient today.  The patient does not have concerns regarding medicines.  The following changes have been made:  None  Labs/ tests ordered today include:  None  No orders of the defined types were placed in this encounter.    Disposition:   FU with me in one year.    Signed, Minus Breeding, MD  07/30/2017 4:27 PM    [place]

## 2017-07-30 ENCOUNTER — Ambulatory Visit (INDEPENDENT_AMBULATORY_CARE_PROVIDER_SITE_OTHER): Payer: 59 | Admitting: Cardiology

## 2017-07-30 ENCOUNTER — Encounter: Payer: Self-pay | Admitting: Cardiology

## 2017-07-30 VITALS — BP 110/80 | HR 77 | Ht 73.0 in | Wt 316.0 lb

## 2017-07-30 DIAGNOSIS — R002 Palpitations: Secondary | ICD-10-CM

## 2017-07-30 DIAGNOSIS — I10 Essential (primary) hypertension: Secondary | ICD-10-CM | POA: Diagnosis not present

## 2017-07-30 NOTE — Patient Instructions (Signed)
Medication Instructions:  Continue current medications  If you need a refill on your cardiac medications before your next appointment, please call your pharmacy.  Labwork: None Ordered   Testing/Procedures: None Ordered  Follow-Up: Your physician wants you to follow-up in: 1 Year. You should receive a reminder letter in the mail two months in advance. If you do not receive a letter, please call our office [phone].    Thank you for choosing [facility] at [facility]!!      

## 2017-10-27 MED FILL — metFORMIN HCL 1000 MG TABS: 1000 | 90 days supply | Qty: 90 | Fill #3

## 2017-10-27 MED FILL — METOPROLOL SUCCINATE ER 50: 50 | 90 days supply | Qty: 90 | Fill #2

## 2017-10-27 MED FILL — LISINOPRIL 20 MG TABLET: 20 | 90 days supply | Qty: 90 | Fill #3

## 2017-10-27 MED FILL — CHLORTHALIDONE 25 MG TAB: 25 | 90 days supply | Qty: 90 | Fill #2

## 2017-10-28 MED FILL — XARELTO 20 MG TABLET: 20 | 90 days supply | Qty: 90 | Fill #0

## 2017-10-28 MED FILL — ATORVASTATIN 20 MG TABLET: 20 | 90 days supply | Qty: 90 | Fill #0

## 2017-11-16 DIAGNOSIS — R05 Cough: Secondary | ICD-10-CM | POA: Diagnosis not present

## 2017-11-16 DIAGNOSIS — J069 Acute upper respiratory infection, unspecified: Secondary | ICD-10-CM | POA: Diagnosis not present

## 2017-12-23 MED FILL — FLUTICASONE PROP 0.05% CRM: 0.05 | 30 days supply | Qty: 30 | Fill #1

## 2018-01-25 MED FILL — XARELTO 20 MG TABLET: 20 | 90 days supply | Qty: 90 | Fill #0

## 2018-01-25 MED FILL — LISINOPRIL 20 MG TABLET: 20 | 90 days supply | Qty: 90 | Fill #0

## 2018-01-25 MED FILL — METOPROLOL SUCCINATE ER 50: 50 | 90 days supply | Qty: 90 | Fill #3

## 2018-01-25 MED FILL — metFORMIN HCL 1000 MG TABS: 1000 | 90 days supply | Qty: 90 | Fill #0

## 2018-01-25 MED FILL — CHLORTHALIDONE 25 MG TAB: 25 | 90 days supply | Qty: 90 | Fill #3

## 2018-01-25 MED FILL — ATORVASTATIN 20 MG TABLET: 20 | 90 days supply | Qty: 90 | Fill #0

## 2018-05-03 ENCOUNTER — Other Ambulatory Visit: Payer: Self-pay | Admitting: Cardiology

## 2018-05-03 MED FILL — LISINOPRIL 20 MG TABLET: 20 | 90 days supply | Qty: 90 | Fill #0

## 2018-05-03 MED FILL — metFORMIN HCL 1000 MG TABS: 1000 | 90 days supply | Qty: 90 | Fill #0

## 2018-05-03 MED FILL — XARELTO 20 MG TABLET: 20 | 90 days supply | Qty: 90 | Fill #0

## 2018-05-03 MED FILL — ATORVASTATIN CALCIUM 20 MG: 20 | 90 days supply | Qty: 90 | Fill #0

## 2018-05-04 MED FILL — METOPROLOL SUCCINATE ER 50: 50 | 90 days supply | Qty: 90 | Fill #0

## 2018-05-04 MED FILL — CHLORTHALIDONE 25 MG TAB: 25 | 90 days supply | Qty: 90 | Fill #0

## 2018-06-09 DIAGNOSIS — B078 Other viral warts: Secondary | ICD-10-CM | POA: Diagnosis not present

## 2018-06-09 DIAGNOSIS — M79671 Pain in right foot: Secondary | ICD-10-CM | POA: Diagnosis not present

## 2018-06-22 DIAGNOSIS — R351 Nocturia: Secondary | ICD-10-CM | POA: Diagnosis not present

## 2018-06-22 DIAGNOSIS — L570 Actinic keratosis: Secondary | ICD-10-CM | POA: Diagnosis not present

## 2018-06-22 DIAGNOSIS — E119 Type 2 diabetes mellitus without complications: Secondary | ICD-10-CM | POA: Diagnosis not present

## 2018-06-22 DIAGNOSIS — R5382 Chronic fatigue, unspecified: Secondary | ICD-10-CM | POA: Diagnosis not present

## 2018-06-25 DIAGNOSIS — B078 Other viral warts: Secondary | ICD-10-CM | POA: Diagnosis not present

## 2018-06-25 DIAGNOSIS — E119 Type 2 diabetes mellitus without complications: Secondary | ICD-10-CM | POA: Diagnosis not present

## 2018-06-25 DIAGNOSIS — R5382 Chronic fatigue, unspecified: Secondary | ICD-10-CM | POA: Diagnosis not present

## 2018-06-25 DIAGNOSIS — M79671 Pain in right foot: Secondary | ICD-10-CM | POA: Diagnosis not present

## 2018-06-25 DIAGNOSIS — R351 Nocturia: Secondary | ICD-10-CM | POA: Diagnosis not present

## 2018-08-02 MED FILL — METOPROLOL SUCCINATE ER 50: 50 | 90 days supply | Qty: 90 | Fill #1

## 2018-08-02 MED FILL — CHLORTHALIDONE 25 MG TABS: 25 | 90 days supply | Qty: 90 | Fill #1

## 2018-08-05 MED FILL — metFORMIN HCL 1000 MG TABS: 1000 | 90 days supply | Qty: 180 | Fill #0

## 2018-08-05 MED FILL — ATORVASTATIN 40 MG TABLET: 40 | 90 days supply | Qty: 90 | Fill #0

## 2018-08-05 MED FILL — XARELTO 20 MG TABLET: 20 | 90 days supply | Qty: 90 | Fill #0

## 2018-08-05 MED FILL — LISINOPRIL 20 MG TABLET: 20 | 90 days supply | Qty: 90 | Fill #0

## 2018-08-05 MED FILL — SILDENAFIL CITRATE 25 MG TA: 25 | 30 days supply | Qty: 6 | Fill #0

## 2018-09-05 DIAGNOSIS — R6889 Other general symptoms and signs: Secondary | ICD-10-CM | POA: Diagnosis not present

## 2018-09-05 DIAGNOSIS — R0602 Shortness of breath: Secondary | ICD-10-CM | POA: Diagnosis not present

## 2018-09-05 DIAGNOSIS — J209 Acute bronchitis, unspecified: Secondary | ICD-10-CM | POA: Diagnosis not present

## 2018-10-07 MED FILL — SILDENAFIL CITRATE 25 MG TA: 25 | 20 days supply | Qty: 4 | Fill #1

## 2018-11-02 DIAGNOSIS — Z86718 Personal history of other venous thrombosis and embolism: Secondary | ICD-10-CM | POA: Diagnosis not present

## 2018-11-02 DIAGNOSIS — E785 Hyperlipidemia, unspecified: Secondary | ICD-10-CM | POA: Diagnosis not present

## 2018-11-02 DIAGNOSIS — E119 Type 2 diabetes mellitus without complications: Secondary | ICD-10-CM | POA: Diagnosis not present

## 2018-11-02 DIAGNOSIS — I10 Essential (primary) hypertension: Secondary | ICD-10-CM | POA: Diagnosis not present

## 2018-11-02 MED FILL — LISINOPRIL 40 MG TABLET: 40 | 90 days supply | Qty: 90 | Fill #0

## 2018-11-02 MED FILL — metFORMIN HCL 1000 MG TABS: 1000 | 90 days supply | Qty: 90 | Fill #0

## 2018-11-02 MED FILL — ATORVASTATIN 40 MG TABLET: 40 | 90 days supply | Qty: 90 | Fill #0

## 2018-11-02 MED FILL — METOPROLOL SUCCINATE ER 50: 50 | 90 days supply | Qty: 90 | Fill #0

## 2018-11-02 MED FILL — XARELTO 10 MG TABLET: 10 | 90 days supply | Qty: 90 | Fill #0

## 2018-11-02 MED FILL — SILDENAFIL CITRATE 20 MG TA: 20 | 10 days supply | Qty: 40 | Fill #0

## 2018-11-08 DIAGNOSIS — Z0000 Encounter for general adult medical examination without abnormal findings: Secondary | ICD-10-CM | POA: Diagnosis not present

## 2018-11-08 DIAGNOSIS — E785 Hyperlipidemia, unspecified: Secondary | ICD-10-CM | POA: Diagnosis not present

## 2018-11-08 DIAGNOSIS — I10 Essential (primary) hypertension: Secondary | ICD-10-CM | POA: Diagnosis not present

## 2018-11-08 DIAGNOSIS — E119 Type 2 diabetes mellitus without complications: Secondary | ICD-10-CM | POA: Diagnosis not present

## 2018-11-16 DIAGNOSIS — I10 Essential (primary) hypertension: Secondary | ICD-10-CM | POA: Diagnosis not present

## 2018-11-16 DIAGNOSIS — E785 Hyperlipidemia, unspecified: Secondary | ICD-10-CM | POA: Diagnosis not present

## 2018-11-16 DIAGNOSIS — I2699 Other pulmonary embolism without acute cor pulmonale: Secondary | ICD-10-CM | POA: Diagnosis not present

## 2018-11-16 DIAGNOSIS — E119 Type 2 diabetes mellitus without complications: Secondary | ICD-10-CM | POA: Diagnosis not present

## 2018-11-29 DIAGNOSIS — I10 Essential (primary) hypertension: Secondary | ICD-10-CM | POA: Diagnosis not present

## 2018-11-29 DIAGNOSIS — M25569 Pain in unspecified knee: Secondary | ICD-10-CM | POA: Diagnosis not present

## 2018-12-27 MED FILL — FLUTICASONE PROP 0.05% LOTI: 0.05 | 60 days supply | Qty: 60 | Fill #0

## 2019-01-25 DIAGNOSIS — Z125 Encounter for screening for malignant neoplasm of prostate: Secondary | ICD-10-CM | POA: Diagnosis not present

## 2019-01-25 DIAGNOSIS — E1169 Type 2 diabetes mellitus with other specified complication: Secondary | ICD-10-CM | POA: Diagnosis not present

## 2019-01-25 DIAGNOSIS — Z86711 Personal history of pulmonary embolism: Secondary | ICD-10-CM | POA: Diagnosis not present

## 2019-01-25 DIAGNOSIS — M179 Osteoarthritis of knee, unspecified: Secondary | ICD-10-CM | POA: Diagnosis not present

## 2019-01-25 DIAGNOSIS — E7800 Pure hypercholesterolemia, unspecified: Secondary | ICD-10-CM | POA: Diagnosis not present

## 2019-01-25 DIAGNOSIS — R4589 Other symptoms and signs involving emotional state: Secondary | ICD-10-CM | POA: Diagnosis not present

## 2019-01-25 DIAGNOSIS — N5201 Erectile dysfunction due to arterial insufficiency: Secondary | ICD-10-CM | POA: Diagnosis not present

## 2019-01-25 DIAGNOSIS — I10 Essential (primary) hypertension: Secondary | ICD-10-CM | POA: Diagnosis not present

## 2019-02-07 MED FILL — buPROPion HCL ER (XL) 150 M: 150 | 30 days supply | Qty: 30 | Fill #0

## 2019-02-07 MED FILL — SILDENAFIL CITRATE 20 MG TA: 20 | 10 days supply | Qty: 50 | Fill #0

## 2019-02-07 MED FILL — LISINOPRIL 40 MG TABLET: 40 | 90 days supply | Qty: 90 | Fill #0

## 2019-02-07 MED FILL — metFORMIN HCL 1000 MG TABS: 1000 | 90 days supply | Qty: 90 | Fill #0

## 2019-02-07 MED FILL — ATORVASTATIN 40 MG TABLET: 40 | 90 days supply | Qty: 90 | Fill #0

## 2019-02-07 MED FILL — GLUCOSAMINE 500 MG CAPSULE: 500 | 120 days supply | Qty: 120 | Fill #0

## 2019-02-07 MED FILL — METOPROLOL TARTRATE 50 MG T: 50 | 90 days supply | Qty: 90 | Fill #0

## 2019-02-07 MED FILL — XARELTO 10 MG TABLET: 10 | 90 days supply | Qty: 90 | Fill #0

## 2019-02-22 DIAGNOSIS — E119 Type 2 diabetes mellitus without complications: Secondary | ICD-10-CM | POA: Diagnosis not present

## 2019-02-22 DIAGNOSIS — I10 Essential (primary) hypertension: Secondary | ICD-10-CM | POA: Diagnosis not present

## 2019-02-22 DIAGNOSIS — E785 Hyperlipidemia, unspecified: Secondary | ICD-10-CM | POA: Diagnosis not present

## 2019-02-22 DIAGNOSIS — I2699 Other pulmonary embolism without acute cor pulmonale: Secondary | ICD-10-CM | POA: Diagnosis not present

## 2019-03-01 DIAGNOSIS — E119 Type 2 diabetes mellitus without complications: Secondary | ICD-10-CM | POA: Diagnosis not present

## 2019-03-01 DIAGNOSIS — I10 Essential (primary) hypertension: Secondary | ICD-10-CM | POA: Diagnosis not present

## 2019-03-01 DIAGNOSIS — M25569 Pain in unspecified knee: Secondary | ICD-10-CM | POA: Diagnosis not present

## 2019-03-01 DIAGNOSIS — E785 Hyperlipidemia, unspecified: Secondary | ICD-10-CM | POA: Diagnosis not present

## 2019-03-02 MED FILL — ATORVASTATIN 80 MG TABLET: 80 | 90 days supply | Qty: 90 | Fill #0

## 2019-03-02 MED FILL — LOSARTAN POTASSIUM 100 MG T: 100 | 90 days supply | Qty: 90 | Fill #0

## 2019-03-09 MED FILL — buPROPion HCL ER (XL) 150 M: 150 | 30 days supply | Qty: 30 | Fill #0

## 2019-03-09 MED FILL — SILDENAFIL CITRATE 20 MG TA: 20 | 10 days supply | Qty: 50 | Fill #0

## 2019-03-29 DIAGNOSIS — Z87891 Personal history of nicotine dependence: Secondary | ICD-10-CM | POA: Diagnosis not present

## 2019-03-29 DIAGNOSIS — E785 Hyperlipidemia, unspecified: Secondary | ICD-10-CM | POA: Diagnosis not present

## 2019-03-29 DIAGNOSIS — Z79899 Other long term (current) drug therapy: Secondary | ICD-10-CM | POA: Diagnosis not present

## 2019-03-29 DIAGNOSIS — E119 Type 2 diabetes mellitus without complications: Secondary | ICD-10-CM | POA: Diagnosis not present

## 2019-03-29 DIAGNOSIS — R200 Anesthesia of skin: Secondary | ICD-10-CM | POA: Diagnosis not present

## 2019-03-29 DIAGNOSIS — Z7984 Long term (current) use of oral hypoglycemic drugs: Secondary | ICD-10-CM | POA: Diagnosis not present

## 2019-03-29 DIAGNOSIS — I10 Essential (primary) hypertension: Secondary | ICD-10-CM | POA: Diagnosis not present

## 2019-03-29 DIAGNOSIS — F329 Major depressive disorder, single episode, unspecified: Secondary | ICD-10-CM | POA: Diagnosis not present

## 2019-03-29 DIAGNOSIS — R51 Headache: Secondary | ICD-10-CM | POA: Diagnosis not present

## 2019-03-29 DIAGNOSIS — Z7901 Long term (current) use of anticoagulants: Secondary | ICD-10-CM | POA: Diagnosis not present

## 2019-04-04 DIAGNOSIS — M1711 Unilateral primary osteoarthritis, right knee: Secondary | ICD-10-CM | POA: Diagnosis not present

## 2019-04-04 DIAGNOSIS — M1712 Unilateral primary osteoarthritis, left knee: Secondary | ICD-10-CM | POA: Diagnosis not present

## 2019-04-19 DIAGNOSIS — I10 Essential (primary) hypertension: Secondary | ICD-10-CM | POA: Diagnosis not present

## 2019-04-19 DIAGNOSIS — I2699 Other pulmonary embolism without acute cor pulmonale: Secondary | ICD-10-CM | POA: Diagnosis not present

## 2019-04-19 DIAGNOSIS — E119 Type 2 diabetes mellitus without complications: Secondary | ICD-10-CM | POA: Diagnosis not present

## 2019-04-19 DIAGNOSIS — E785 Hyperlipidemia, unspecified: Secondary | ICD-10-CM | POA: Diagnosis not present

## 2019-04-22 MED FILL — LOSARTAN-HCTZ 100-25 MG TAB: 100-25 | 90 days supply | Qty: 90 | Fill #0

## 2019-05-10 MED FILL — XARELTO 10 MG TABLET: 10 | 90 days supply | Qty: 90 | Fill #1

## 2019-05-10 MED FILL — metFORMIN HCL 1000 MG TABS: 1000 | 90 days supply | Qty: 90 | Fill #1

## 2019-05-10 MED FILL — METOPROLOL TARTRATE 50 MG T: 50 | 90 days supply | Qty: 90 | Fill #1

## 2019-05-11 MED FILL — metFORMIN HCL 1000 MG TABS: 1000 | 90 days supply | Qty: 90 | Fill #0

## 2019-05-11 MED FILL — XARELTO 10 MG TABLET: 10 | 90 days supply | Qty: 90 | Fill #0

## 2019-05-11 MED FILL — METOPROLOL TARTRATE 50 MG T: 50 | 90 days supply | Qty: 90 | Fill #0

## 2019-05-17 DIAGNOSIS — E119 Type 2 diabetes mellitus without complications: Secondary | ICD-10-CM | POA: Diagnosis not present

## 2019-05-17 DIAGNOSIS — G4730 Sleep apnea, unspecified: Secondary | ICD-10-CM | POA: Diagnosis not present

## 2019-05-17 DIAGNOSIS — E785 Hyperlipidemia, unspecified: Secondary | ICD-10-CM | POA: Diagnosis not present

## 2019-05-17 DIAGNOSIS — I10 Essential (primary) hypertension: Secondary | ICD-10-CM | POA: Diagnosis not present

## 2019-05-17 DIAGNOSIS — I2699 Other pulmonary embolism without acute cor pulmonale: Secondary | ICD-10-CM | POA: Diagnosis not present

## 2019-05-26 DIAGNOSIS — E119 Type 2 diabetes mellitus without complications: Secondary | ICD-10-CM | POA: Diagnosis not present

## 2019-05-26 DIAGNOSIS — E785 Hyperlipidemia, unspecified: Secondary | ICD-10-CM | POA: Diagnosis not present

## 2019-05-26 DIAGNOSIS — I10 Essential (primary) hypertension: Secondary | ICD-10-CM | POA: Diagnosis not present

## 2019-05-26 DIAGNOSIS — F329 Major depressive disorder, single episode, unspecified: Secondary | ICD-10-CM | POA: Diagnosis not present

## 2019-06-22 MED FILL — ATORVASTATIN 80 MG TABLET: 80 | 90 days supply | Qty: 90 | Fill #1

## 2019-07-20 DIAGNOSIS — Z20828 Contact with and (suspected) exposure to other viral communicable diseases: Secondary | ICD-10-CM | POA: Diagnosis not present

## 2019-07-23 ENCOUNTER — Ambulatory Visit (INDEPENDENT_AMBULATORY_CARE_PROVIDER_SITE_OTHER): Payer: 59

## 2019-07-23 ENCOUNTER — Inpatient Hospital Stay (HOSPITAL_COMMUNITY)
Admission: EM | Admit: 2019-07-23 | Discharge: 2019-07-29 | DRG: 177 | Disposition: A | Discharge status: Home / Self Care | Payer: 59 | Attending: Family Medicine | Admitting: Family Medicine

## 2019-07-23 ENCOUNTER — Emergency Department (HOSPITAL_COMMUNITY): Payer: 59

## 2019-07-23 ENCOUNTER — Ambulatory Visit (INDEPENDENT_AMBULATORY_CARE_PROVIDER_SITE_OTHER)
Admission: EM | Admit: 2019-07-23 | Discharge: 2019-07-23 | Disposition: A | Discharge status: Home / Self Care | Payer: 59 | Source: Home / Self Care

## 2019-07-23 ENCOUNTER — Encounter (HOSPITAL_COMMUNITY): Payer: Self-pay | Admitting: Emergency Medicine

## 2019-07-23 ENCOUNTER — Encounter (HOSPITAL_COMMUNITY): Payer: Self-pay

## 2019-07-23 ENCOUNTER — Other Ambulatory Visit: Payer: Self-pay

## 2019-07-23 ENCOUNTER — Other Ambulatory Visit (HOSPITAL_COMMUNITY): Payer: 59

## 2019-07-23 DIAGNOSIS — I10 Essential (primary) hypertension: Secondary | ICD-10-CM | POA: Insufficient documentation

## 2019-07-23 DIAGNOSIS — E1165 Type 2 diabetes mellitus with hyperglycemia: Secondary | ICD-10-CM | POA: Diagnosis present

## 2019-07-23 DIAGNOSIS — E785 Hyperlipidemia, unspecified: Secondary | ICD-10-CM | POA: Diagnosis present

## 2019-07-23 DIAGNOSIS — Z86711 Personal history of pulmonary embolism: Secondary | ICD-10-CM | POA: Diagnosis not present

## 2019-07-23 DIAGNOSIS — Z9989 Dependence on other enabling machines and devices: Secondary | ICD-10-CM | POA: Diagnosis not present

## 2019-07-23 DIAGNOSIS — I82409 Acute embolism and thrombosis of unspecified deep veins of unspecified lower extremity: Secondary | ICD-10-CM | POA: Diagnosis present

## 2019-07-23 DIAGNOSIS — Z79899 Other long term (current) drug therapy: Secondary | ICD-10-CM | POA: Insufficient documentation

## 2019-07-23 DIAGNOSIS — G4733 Obstructive sleep apnea (adult) (pediatric): Secondary | ICD-10-CM | POA: Insufficient documentation

## 2019-07-23 DIAGNOSIS — R7401 Elevation of levels of liver transaminase levels: Secondary | ICD-10-CM | POA: Diagnosis present

## 2019-07-23 DIAGNOSIS — E876 Hypokalemia: Secondary | ICD-10-CM | POA: Diagnosis present

## 2019-07-23 DIAGNOSIS — N289 Disorder of kidney and ureter, unspecified: Secondary | ICD-10-CM | POA: Diagnosis not present

## 2019-07-23 DIAGNOSIS — R0902 Hypoxemia: Secondary | ICD-10-CM | POA: Diagnosis not present

## 2019-07-23 DIAGNOSIS — Z86718 Personal history of other venous thrombosis and embolism: Secondary | ICD-10-CM | POA: Insufficient documentation

## 2019-07-23 DIAGNOSIS — J9601 Acute respiratory failure with hypoxia: Secondary | ICD-10-CM | POA: Diagnosis present

## 2019-07-23 DIAGNOSIS — N179 Acute kidney failure, unspecified: Secondary | ICD-10-CM | POA: Diagnosis present

## 2019-07-23 DIAGNOSIS — Z8249 Family history of ischemic heart disease and other diseases of the circulatory system: Secondary | ICD-10-CM | POA: Insufficient documentation

## 2019-07-23 DIAGNOSIS — J1289 Other viral pneumonia: Secondary | ICD-10-CM | POA: Diagnosis present

## 2019-07-23 DIAGNOSIS — R05 Cough: Secondary | ICD-10-CM | POA: Diagnosis not present

## 2019-07-23 DIAGNOSIS — R000 Tachycardia, unspecified: Secondary | ICD-10-CM | POA: Diagnosis not present

## 2019-07-23 DIAGNOSIS — U071 COVID-19: Secondary | ICD-10-CM | POA: Insufficient documentation

## 2019-07-23 DIAGNOSIS — Z7901 Long term (current) use of anticoagulants: Secondary | ICD-10-CM | POA: Insufficient documentation

## 2019-07-23 DIAGNOSIS — J189 Pneumonia, unspecified organism: Secondary | ICD-10-CM

## 2019-07-23 DIAGNOSIS — Z7984 Long term (current) use of oral hypoglycemic drugs: Secondary | ICD-10-CM | POA: Insufficient documentation

## 2019-07-23 DIAGNOSIS — Z6841 Body Mass Index (BMI) 40.0 and over, adult: Secondary | ICD-10-CM

## 2019-07-23 DIAGNOSIS — Z832 Family history of diseases of the blood and blood-forming organs and certain disorders involving the immune mechanism: Secondary | ICD-10-CM

## 2019-07-23 DIAGNOSIS — E119 Type 2 diabetes mellitus without complications: Secondary | ICD-10-CM

## 2019-07-23 DIAGNOSIS — Z87891 Personal history of nicotine dependence: Secondary | ICD-10-CM | POA: Insufficient documentation

## 2019-07-23 LAB — COMPREHENSIVE METABOLIC PANEL
ALT: 53 U/L — ABNORMAL HIGH (ref 0–44)
AST: 60 U/L — ABNORMAL HIGH (ref 15–41)
Albumin: 3.7 g/dL (ref 3.5–5.0)
Alkaline Phosphatase: 76 U/L (ref 38–126)
Anion gap: 16 — ABNORMAL HIGH (ref 5–15)
BUN: 31 mg/dL — ABNORMAL HIGH (ref 6–20)
CO2: 20 mmol/L — ABNORMAL LOW (ref 22–32)
Calcium: 8 mg/dL — ABNORMAL LOW (ref 8.9–10.3)
Chloride: 99 mmol/L (ref 98–111)
Creatinine, Ser: 3.03 mg/dL — ABNORMAL HIGH (ref 0.61–1.24)
GFR calc Af Amer: 25 mL/min — ABNORMAL LOW (ref >60)
GFR calc non Af Amer: 22 mL/min — ABNORMAL LOW (ref >60)
Glucose, Bld: 193 mg/dL — ABNORMAL HIGH (ref 70–99)
Potassium: 3 mmol/L — ABNORMAL LOW (ref 3.5–5.1)
Sodium: 135 mmol/L (ref 135–145)
Total Bilirubin: 0.6 mg/dL (ref 0.3–1.2)
Total Protein: 8 g/dL (ref 6.5–8.1)

## 2019-07-23 LAB — CBC WITH DIFFERENTIAL/PLATELET
Abs Immature Granulocytes: 0.05 10*3/uL (ref 0.00–0.07)
Basophils Absolute: 0 10*3/uL (ref 0.0–0.1)
Basophils Relative: 0 %
Eosinophils Absolute: 0 10*3/uL (ref 0.0–0.5)
Eosinophils Relative: 0 %
HCT: 43 % (ref 39.0–52.0)
Hemoglobin: 14.4 g/dL (ref 13.0–17.0)
Immature Granulocytes: 1 %
Lymphocytes Relative: 11 %
Lymphs Abs: 1 10*3/uL (ref 0.7–4.0)
MCH: 28.4 pg (ref 26.0–34.0)
MCHC: 33.5 g/dL (ref 30.0–36.0)
MCV: 84.8 fL (ref 80.0–100.0)
Monocytes Absolute: 0.4 10*3/uL (ref 0.1–1.0)
Monocytes Relative: 5 %
Neutro Abs: 7.3 10*3/uL (ref 1.7–7.7)
Neutrophils Relative %: 83 %
Platelets: 259 10*3/uL (ref 150–400)
RBC: 5.07 MIL/uL (ref 4.22–5.81)
RDW: 14.2 % (ref 11.5–15.5)
WBC: 8.8 10*3/uL (ref 4.0–10.5)
nRBC: 0 % (ref 0.0–0.2)

## 2019-07-23 LAB — ABO/RH: ABO/RH(D): A POS

## 2019-07-23 LAB — FIBRINOGEN: Fibrinogen: 577 mg/dL — ABNORMAL HIGH (ref 210–475)

## 2019-07-23 LAB — D-DIMER, QUANTITATIVE: D-Dimer, Quant: 0.68 ug/mL-FEU — ABNORMAL HIGH (ref 0.00–0.50)

## 2019-07-23 LAB — LACTIC ACID, PLASMA: Lactic Acid, Venous: 1.6 mmol/L (ref 0.5–1.9)

## 2019-07-23 LAB — LIPID PANEL
Cholesterol: 101 mg/dL (ref 0–200)
HDL: 21 mg/dL — ABNORMAL LOW (ref >40)
LDL Cholesterol: 48 mg/dL (ref 0–99)
Total CHOL/HDL Ratio: 4.8 RATIO
Triglycerides: 159 mg/dL — ABNORMAL HIGH (ref <150)
VLDL: 32 mg/dL (ref 0–40)

## 2019-07-23 LAB — HEMOGLOBIN A1C
Hgb A1c MFr Bld: 7.8 % — ABNORMAL HIGH (ref 4.8–5.6)
Mean Plasma Glucose: 177.16 mg/dL

## 2019-07-23 LAB — HIV ANTIBODY (ROUTINE TESTING W REFLEX): HIV Screen 4th Generation wRfx: NONREACTIVE

## 2019-07-23 LAB — BRAIN NATRIURETIC PEPTIDE: B Natriuretic Peptide: 21.1 pg/mL (ref 0.0–100.0)

## 2019-07-23 LAB — CBG MONITORING, ED: Glucose-Capillary: 241 mg/dL — ABNORMAL HIGH (ref 70–99)

## 2019-07-23 LAB — HEPATITIS B SURFACE ANTIGEN: Hepatitis B Surface Ag: NONREACTIVE

## 2019-07-23 LAB — PROCALCITONIN: Procalcitonin: <0.1 ng/mL

## 2019-07-23 LAB — FERRITIN: Ferritin: 614 ng/mL — ABNORMAL HIGH (ref 24–336)

## 2019-07-23 LAB — TROPONIN I (HIGH SENSITIVITY): Troponin I (High Sensitivity): 19 ng/L — ABNORMAL HIGH (ref <18)

## 2019-07-23 LAB — LACTATE DEHYDROGENASE: LDH: 369 U/L — ABNORMAL HIGH (ref 98–192)

## 2019-07-23 LAB — C-REACTIVE PROTEIN: CRP: 10 mg/dL — ABNORMAL HIGH (ref <1.0)

## 2019-07-23 MED ORDER — ACETAMINOPHEN 325 MG PO TABS: 650.0000 mg | ORAL_TABLET | Freq: Four times a day (QID) | ORAL | Status: DC | PRN

## 2019-07-23 MED ORDER — INSULIN ASPART 100 UNIT/ML ~~LOC~~ SOLN
0.0000 [IU] | Freq: Three times a day (TID) | SUBCUTANEOUS | Status: DC
  Administered 2019-07-24: 8 [IU] via SUBCUTANEOUS
  Administered 2019-07-24: 15 [IU] via SUBCUTANEOUS
  Administered 2019-07-24: 8 [IU] via SUBCUTANEOUS
  Administered 2019-07-25 (×3): 11 [IU] via SUBCUTANEOUS
  Administered 2019-07-26: 8 [IU] via SUBCUTANEOUS
  Administered 2019-07-26: 11 [IU] via SUBCUTANEOUS
  Administered 2019-07-27: 15 [IU] via SUBCUTANEOUS
  Administered 2019-07-27: 8 [IU] via SUBCUTANEOUS

## 2019-07-23 MED ORDER — POTASSIUM CHLORIDE CRYS ER 20 MEQ PO TBCR
40.0000 meq | EXTENDED_RELEASE_TABLET | Freq: Once | ORAL | Status: AC
  Administered 2019-07-23: 40 meq via ORAL
  Filled 2019-07-23: qty 2

## 2019-07-23 MED ORDER — SODIUM CHLORIDE 0.9 % IV SOLN
INTRAVENOUS | Status: DC
  Administered 2019-07-23 – 2019-07-26 (×6): via INTRAVENOUS

## 2019-07-23 MED ORDER — INSULIN ASPART 100 UNIT/ML ~~LOC~~ SOLN
0.0000 [IU] | Freq: Every day | SUBCUTANEOUS | Status: DC
  Administered 2019-07-23: 2 [IU] via SUBCUTANEOUS
  Administered 2019-07-24: 4 [IU] via SUBCUTANEOUS
  Administered 2019-07-25: 5 [IU] via SUBCUTANEOUS
  Administered 2019-07-26: 3 [IU] via SUBCUTANEOUS

## 2019-07-23 MED ORDER — RIVAROXABAN 10 MG PO TABS
10.0000 mg | ORAL_TABLET | Freq: Every day | ORAL | Status: DC
  Administered 2019-07-23 – 2019-07-28 (×6): 10 mg via ORAL
  Filled 2019-07-23 (×8): qty 1

## 2019-07-23 MED ORDER — SODIUM CHLORIDE 0.9% FLUSH: 3.0000 mL | Freq: Once | INTRAVENOUS | Status: DC

## 2019-07-23 MED ORDER — ALBUTEROL SULFATE (2.5 MG/3ML) 0.083% IN NEBU: 2.5000 mg | INHALATION_SOLUTION | RESPIRATORY_TRACT | Status: DC | PRN

## 2019-07-23 MED ORDER — ATORVASTATIN CALCIUM 10 MG PO TABS
20.0000 mg | ORAL_TABLET | Freq: Every day | ORAL | Status: DC
  Administered 2019-07-23 – 2019-07-29 (×7): 20 mg via ORAL
  Filled 2019-07-23 (×7): qty 2

## 2019-07-23 MED ORDER — METHYLPREDNISOLONE SODIUM SUCC 125 MG IJ SOLR
0.5000 mg/kg | Freq: Two times a day (BID) | INTRAMUSCULAR | Status: AC
  Administered 2019-07-23 – 2019-07-27 (×9): 68.125 mg via INTRAVENOUS
  Filled 2019-07-23 (×9): qty 2

## 2019-07-23 MED ORDER — METOPROLOL SUCCINATE ER 25 MG PO TB24
50.0000 mg | ORAL_TABLET | Freq: Every day | ORAL | Status: DC
  Administered 2019-07-23 – 2019-07-29 (×7): 50 mg via ORAL
  Filled 2019-07-23: qty 1
  Filled 2019-07-23: qty 2
  Filled 2019-07-23 (×2): qty 1
  Filled 2019-07-23 (×3): qty 2

## 2019-07-23 MED ORDER — GUAIFENESIN-DM 100-10 MG/5ML PO SYRP
10.0000 mL | ORAL_SOLUTION | ORAL | Status: DC | PRN
  Administered 2019-07-24 – 2019-07-28 (×5): 10 mL via ORAL
  Filled 2019-07-23 (×5): qty 10

## 2019-07-23 MED ORDER — RIVAROXABAN 10 MG PO TABS: 10.0000 mg | ORAL_TABLET | Freq: Every day | ORAL | Status: DC

## 2019-07-23 MED ORDER — RIVAROXABAN 20 MG PO TABS: 20.0000 mg | ORAL_TABLET | Freq: Every day | ORAL | Status: DC

## 2019-07-23 MED ORDER — POTASSIUM CHLORIDE 20 MEQ PO PACK
40.0000 meq | PACK | Freq: Two times a day (BID) | ORAL | Status: AC
  Administered 2019-07-23 – 2019-07-24 (×2): 40 meq via ORAL
  Filled 2019-07-23 (×3): qty 2

## 2019-07-23 MED ORDER — ONDANSETRON HCL 4 MG/2ML IJ SOLN
4.0000 mg | Freq: Four times a day (QID) | INTRAMUSCULAR | Status: DC | PRN
  Administered 2019-07-26: 4 mg via INTRAVENOUS
  Filled 2019-07-23: qty 2

## 2019-07-23 MED ORDER — ONDANSETRON HCL 4 MG PO TABS: 4.0000 mg | ORAL_TABLET | Freq: Four times a day (QID) | ORAL | Status: DC | PRN

## 2019-07-23 MED ORDER — HYDROCOD POLST-CPM POLST ER 10-8 MG/5ML PO SUER
5.0000 mL | Freq: Two times a day (BID) | ORAL | Status: DC | PRN
  Administered 2019-07-24 – 2019-07-28 (×4): 5 mL via ORAL
  Filled 2019-07-23 (×4): qty 5

## 2019-07-23 MED ORDER — VITAMIN C 500 MG PO TABS
500.0000 mg | ORAL_TABLET | Freq: Every day | ORAL | Status: DC
  Administered 2019-07-23 – 2019-07-29 (×7): 500 mg via ORAL
  Filled 2019-07-23 (×7): qty 1

## 2019-07-23 MED ORDER — DEXAMETHASONE SODIUM PHOSPHATE 10 MG/ML IJ SOLN
10.0000 mg | Freq: Once | INTRAMUSCULAR | Status: AC
  Administered 2019-07-23: 10 mg via INTRAVENOUS
  Filled 2019-07-23: qty 1

## 2019-07-23 MED ORDER — ALBUTEROL SULFATE HFA 108 (90 BASE) MCG/ACT IN AERS
6.0000 | INHALATION_SPRAY | Freq: Once | RESPIRATORY_TRACT | Status: AC
  Administered 2019-07-23: 6 via RESPIRATORY_TRACT
  Filled 2019-07-23: qty 6.7

## 2019-07-23 MED ORDER — ZINC SULFATE 220 (50 ZN) MG PO CAPS
220.0000 mg | ORAL_CAPSULE | Freq: Every day | ORAL | Status: DC
  Administered 2019-07-23 – 2019-07-29 (×7): 220 mg via ORAL
  Filled 2019-07-23 (×7): qty 1

## 2019-07-23 NOTE — ED Provider Notes (Signed)
[facility] EMERGENCY DEPARTMENT Provider Note   CSN: [id number] Arrival date & time: 07/23/19  1432     History   Chief Complaint Chief Complaint  Patient presents with  . Shortness of Breath  . Diarrhea    HPI Ronald Woodward is a 58 y.o. male with PMH significant for pulmonary embolism, OSA, DM, and HLD who presents to the ED with a 4-day history of worsening shortness of breath, fevers, headache, loss of appetite, mildly productive cough, body aches, and loose stools.  Evidently, patient had [facility], but results are still pending.  Since then, he is continued to worsen.  He has been taking DayQuil and NyQuil as well as Tylenol and ibuprofen for his symptoms, but with little relief.  He does not typically require oxygen at home, but states that he has been feeling as though he cannot catch his breath.  He denies any overt chest pain, abdominal pain, vomiting, unilateral leg swelling, dizziness, visual changes, or other neurologic deficit.      HPI  Past Medical History:  Diagnosis Date  . Diabetes ([place])   . DVT (deep venous thrombosis) ([place])   . HTN (hypertension)   . Hyperlipidemia   . Obesity   . Palpitation   . PE (physical exam), annual   . Sleep apnea    CPAP    Patient Active Problem List   Diagnosis Date Noted  . Acute respiratory failure with hypoxia ([place]) 07/23/2019  . Diabetes ([place]) 07/23/2019  . OSA on CPAP 07/23/2019  . AKI (acute kidney injury) ([place]) 07/23/2019  . Hypokalemia 07/23/2019  . Essential hypertension 07/30/2017  . Morbid obesity ([place]) 04/29/2017  . SOB (shortness of breath) 04/29/2017  . Palpitation 07/07/2014  . DVT (deep venous thrombosis) ([place]) 07/07/2014    Past Surgical History:  Procedure Laterality Date  . Athroscopic     Knee (right)  . TONSILLECTOMY AND ADENOIDECTOMY          Home Medications    Prior to Admission medications   Medication Sig Start Date End Date  Taking? Authorizing Provider  atorvastatin (LIPITOR) 20 MG tablet Take 20 mg by mouth daily.    [provider]  chlorthalidone (HYGROTON) 25 MG tablet TAKE 1 TABLET (25 MG TOTAL) BY MOUTH DAILY. 05/04/18   Minus Breeding, MD  Glucosamine Sulfate 500 MG CAPS Take by mouth.    [provider]  lisinopril (PRINIVIL,ZESTRIL) 20 MG tablet Take 20 mg by mouth daily.    [provider]  loratadine (CLARITIN) 10 MG tablet Take by mouth.    [provider]  losartan-hydrochlorothiazide (HYZAAR) 100-25 MG tablet Take 1 tablet by mouth daily. 03/29/19   [provider]  metformin (FORTAMET) 1000 MG (OSM) 24 hr tablet Take 1,000 mg by mouth daily with breakfast.    [provider]  metoprolol succinate (TOPROL-XL) 50 MG 24 hr tablet TAKE 1 TABLET (50 MG TOTAL) BY MOUTH DAILY. 05/04/18   Minus Breeding, MD  rivaroxaban (XARELTO) 20 MG TABS tablet Take 20 mg by mouth daily with supper.    [provider]    Family History Family History  Problem Relation Age of Onset  . Clotting disorder Father   . Hypertension Mother     Social History Social History   Tobacco Use  . Smoking status: Former Smoker    Packs/day: 0.50    Years: 10.00    Pack years: 5.00  Types: Cigarettes  . Smokeless tobacco: Former Systems developer    Quit date: 06/22/2013  Substance Use Topics  . Alcohol use: No  . Drug use: No     Allergies   Patient has no known allergies.   Review of Systems Review of Systems  All other systems reviewed and are negative.    Physical Exam Updated Vital Signs BP (!) 145/75   Pulse 95   Temp 99.6 F (37.6 C) (Oral)   Resp (!) 31   Ht 6' (1.829 m)   Wt (!) 136.5 kg   SpO2 92%   BMI 40.82 kg/m   Physical Exam Vitals signs and nursing note reviewed. Exam conducted with a chaperone present.  Constitutional:      Appearance: Normal appearance.  HENT:     Head: Normocephalic and atraumatic.  Eyes:     General: No  scleral icterus.    Conjunctiva/sclera: Conjunctivae normal.  Neck:     Musculoskeletal: Normal range of motion and neck supple. No neck rigidity.  Cardiovascular:     Rate and Rhythm: Regular rhythm. Tachycardia present.     Pulses: Normal pulses.     Heart sounds: Normal heart sounds.  Pulmonary:     Comments: Increased respiratory effort.  Tachypneic.  Mild accessory muscle use.  No abnormal breath sounds. Abdominal:     General: Abdomen is flat. There is no distension.     Palpations: Abdomen is soft.     Tenderness: There is no abdominal tenderness. There is no guarding.  Musculoskeletal:     Right lower leg: No edema.     Left lower leg: No edema.  Skin:    General: Skin is dry.  Neurological:     Mental Status: He is alert.     GCS: GCS eye subscore is 4. GCS verbal subscore is 5. GCS motor subscore is 6.  Psychiatric:        Mood and Affect: Mood normal.        Behavior: Behavior normal.        Thought Content: Thought content normal.      ED Treatments / Results  Labs (all labs ordered are listed, but only abnormal results are displayed) Labs Reviewed  COMPREHENSIVE METABOLIC PANEL - Abnormal; Notable for the following components:      Result Value   Potassium 3.0 (*)    CO2 20 (*)    Glucose, Bld 193 (*)    BUN 31 (*)    Creatinine, Ser 3.03 (*)    Calcium 8.0 (*)    AST 60 (*)    ALT 53 (*)    GFR calc non Af Amer 22 (*)    GFR calc Af Amer 25 (*)    Anion gap 16 (*)    All other components within normal limits  HEMOGLOBIN A1C - Abnormal; Notable for the following components:   Hgb A1c MFr Bld 7.8 (*)    All other components within normal limits  CULTURE, BLOOD (ROUTINE X 2)  CULTURE, BLOOD (ROUTINE X 2)  LACTIC ACID, PLASMA  CBC WITH DIFFERENTIAL/PLATELET  HIV ANTIBODY (ROUTINE TESTING W REFLEX)  BRAIN NATRIURETIC PEPTIDE  C-REACTIVE PROTEIN  D-DIMER, QUANTITATIVE (NOT AT [facility])  FERRITIN  FIBRINOGEN  HEPATITIS B SURFACE ANTIGEN  LACTATE  DEHYDROGENASE  PROCALCITONIN  CBC WITH DIFFERENTIAL/PLATELET  COMPREHENSIVE METABOLIC PANEL  C-REACTIVE PROTEIN  D-DIMER, QUANTITATIVE (NOT AT [facility])  FERRITIN  MAGNESIUM  PHOSPHORUS  LIPID PANEL  ABO/RH  TROPONIN I (HIGH SENSITIVITY)  EKG None  Radiology Dg Chest 2 View  Result Date: 07/23/2019 CLINICAL DATA:  Cough and wheezing. EXAM: CHEST - 2 VIEW COMPARISON:  None. FINDINGS: The heart size and mediastinal contours are within normal limits. Pulmonary interstitial prominence bilaterally and suggestion peripheral infiltrate/opacity in the left mid to lower lung. Scarring/atelectasis at the right lung base. No overt airspace edema, pleural effusions or pneumothorax. No focal nodule. The visualized skeletal structures are unremarkable. IMPRESSION: Pulmonary interstitial prominence bilaterally and suggestion of potential peripheral infiltrate in the left mid to lower lung. This can be seen with atypical pneumonia including viral infections such as COVID-19. Electronically Signed   By: Aletta Edouard M.D.   On: 07/23/2019 14:09    Procedures Procedures (including critical care time)  Medications Ordered in ED Medications  sodium chloride flush (NS) 0.9 % injection 3 mL (3 mLs Intravenous Not Given 07/23/19 1503)  0.9 %  sodium chloride infusion ( Intravenous New Bag/Given 07/23/19 1813)  methylPREDNISolone sodium succinate (SOLU-MEDROL) 125 mg/2 mL injection 68.125 mg (68.125 mg Intravenous Given 07/23/19 1811)  guaiFENesin-dextromethorphan (ROBITUSSIN DM) 100-10 MG/5ML syrup 10 mL (has no administration in time range)  chlorpheniramine-HYDROcodone (TUSSIONEX) 10-8 MG/5ML suspension 5 mL (has no administration in time range)  vitamin C (ASCORBIC ACID) tablet 500 mg (500 mg Oral Given 07/23/19 1814)  zinc sulfate capsule 220 mg (has no administration in time range)  acetaminophen (TYLENOL) tablet 650 mg (has no administration in time range)  ondansetron (ZOFRAN) tablet 4 mg (has  no administration in time range)    Or  ondansetron (ZOFRAN) injection 4 mg (has no administration in time range)  insulin aspart (novoLOG) injection 0-15 Units (has no administration in time range)  insulin aspart (novoLOG) injection 0-5 Units (has no administration in time range)  potassium chloride (KLOR-CON) packet 40 mEq (has no administration in time range)  potassium chloride SA (KLOR-CON) CR tablet 40 mEq (40 mEq Oral Given 07/23/19 1719)  dexamethasone (DECADRON) injection 10 mg (10 mg Intravenous Given 07/23/19 1720)  albuterol (VENTOLIN HFA) 108 (90 Base) MCG/ACT inhaler 6 puff (6 puffs Inhalation Given 07/23/19 1722)     Initial Impression / Assessment and Plan / ED Course  I have reviewed the triage vital signs and the nursing notes.  Pertinent labs & imaging results that were available during my care of the patient were reviewed by me and considered in my medical decision making (see chart for details).        DG chest was obtained and was interpreted, demonstrating findings consistent with an atypical pneumonia such as those caused by COVID-19.  His history and reported symptoms are also consistent with that diagnosis.  During my evaluation, patient was 92% on 2 L supplemental O2 while resting comfortably in bed when he does not typically require oxygen at home.  Given his progressively worsening shortness of breath symptoms in conjunction with his tachypnea, tachycardia, and hypoxia, recommend that he get admitted for ongoing evaluation and management.   Patient has a history of pulmonary embolism, recent travel, and "laying around" x7 days, but he feels as though his symptoms are significantly different from his previous PE.  Given that this has been a progressively worsening shortness of breath rather than an acute onset, suspect this is not likely related to a clot.  Patient is also taking Xarelto. He denies any CP and I am not concerned for cardiac etiology of his shortness  of breath symptoms.  CMP demonstrates transaminitis, possibly due to viral infection.  Most  notably however, CMP demonstrates an acute kidney injury with elevated creatinine at 3.03 and GFR decreased to 22.  There is no baseline to which to compare.  Patient was given potassium p.o., Decadron IV, and albuterol x6 pulse while here in the ED.  Patient sees Harle Battiest MD with [facility].  Order placed to hospitalist for admission.     Final Clinical Impressions(s) / ED Diagnoses   Final diagnoses:  Hypoxia  COVID-19    ED Discharge Orders    None       Corena Herter, PA-C 07/23/19 1831    Milton Ferguson, MD 07/24/19 1256

## 2019-07-23 NOTE — ED Triage Notes (Signed)
C/o congestion, SOB with O2 sats <90% at home, loss of appetite, fever, nausea, 20 lb weight loss, cough with clear phlegm, generalized body aches, and diarrhea since last Saturday.  [facility] with pending results.  Sent from [facility].

## 2019-07-23 NOTE — ED Notes (Signed)
CXR completed at [facility] 

## 2019-07-23 NOTE — ED Notes (Signed)
Report given to 2 west

## 2019-07-23 NOTE — H&P (Signed)
History and Physical    Ronald Woodward [id number] DOB: 06-10-1961 DOA: 07/23/2019  PCP: System, Provider Not In  Patient coming from: Home I have personally briefly reviewed patient's old medical records in [place]  Chief Complaint: Fever, chills, shortness of breath, cough since 4 to 5 days  HPI: Ronald Woodward is a 58 y.o. male with medical history significant of morbid obesity with BMI of 40, hypertension, diabetes mellitus, obstructive sleep apnea on CPAP, hyperlipidemia, unprovoked DVT-on Xarelto for many years, presents to emergency department due to worsening of shortness of breath, subjective fever, chills, cough since 4 to 5 days.  Reports dry cough, decreased appetite, generalized body ache, loose stool, nausea, weakness.  [facility] however has not had results back yet.  He is compliant with his Xarelto and denies chest pain, leg swelling, palpitation, headache, blurry vision, orthopnea, PND, wheezing, urinary or sleep changes.  He lives with his wife and denies smoking, illicit drug use however drinks alcohol occasionally.  ED Course: Upon arrival: Patient was tachycardic, hypoxic with oxygen saturation of 88%-placed on 2 L of oxygen via nasal cannula.  CBC: No leukocytosis.  CMP shows potassium of 3.0, AKI, lactic acid: WNL.  COVID-19 negative, chest x-ray suggestive of atypical pneumonia including viral infection such as COVID-19.  Review of Systems: As per HPI otherwise negative.    Past Medical History:  Diagnosis Date  . Diabetes ([place])   . DVT (deep venous thrombosis) ([place])   . HTN (hypertension)   . Hyperlipidemia   . Obesity   . Palpitation   . PE (physical exam), annual   . Sleep apnea    CPAP    Past Surgical History:  Procedure Laterality Date  . Athroscopic     Knee (right)  . TONSILLECTOMY AND ADENOIDECTOMY       reports that he has quit smoking. His smoking use included cigarettes. He has a 5.00 pack-year  smoking history. He quit smokeless tobacco use about 6 years ago. He reports that he does not drink alcohol or use drugs.  No Known Allergies  Family History  Problem Relation Age of Onset  . Clotting disorder Father   . Hypertension Mother     Prior to Admission medications   Medication Sig Start Date End Date Taking? Authorizing Provider  atorvastatin (LIPITOR) 20 MG tablet Take 20 mg by mouth daily.    [provider]  chlorthalidone (HYGROTON) 25 MG tablet TAKE 1 TABLET (25 MG TOTAL) BY MOUTH DAILY. 05/04/18   Minus Breeding, MD  Glucosamine Sulfate 500 MG CAPS Take by mouth.    [provider]  lisinopril (PRINIVIL,ZESTRIL) 20 MG tablet Take 20 mg by mouth daily.    [provider]  loratadine (CLARITIN) 10 MG tablet Take by mouth.    [provider]  losartan-hydrochlorothiazide (HYZAAR) 100-25 MG tablet Take 1 tablet by mouth daily. 03/29/19   [provider]  metformin (FORTAMET) 1000 MG (OSM) 24 hr tablet Take 1,000 mg by mouth daily with breakfast.    [provider]  metoprolol succinate (TOPROL-XL) 50 MG 24 hr tablet TAKE 1 TABLET (50 MG TOTAL) BY MOUTH DAILY. 05/04/18   Minus Breeding, MD  rivaroxaban (XARELTO) 20 MG TABS tablet Take 20 mg by mouth daily with supper.    [provider]    Physical Exam: Vitals:   07/23/19 1545 07/23/19 1600 07/23/19 1615 07/23/19 1630  BP: (!) 157/83 (!) 159/81 (!) 147/83 Marland Kitchen)  163/85  Pulse: (!) 101 97 97 94  Resp: (!) 23 (!) 21 20 (!) 23  Temp:      TempSrc:      SpO2: 92% 94% 94% 93%  Weight:      Height:        Constitutional: NAD, calm, comfortable, communicating well, on 2 L of oxygen via nasal cannula. Eyes: PERRL, lids and conjunctivae normal ENMT: Mucous membranes are moist. Posterior pharynx clear of any exudate or lesions.Normal dentition.  Neck: normal, supple, no masses, no thyromegaly Respiratory: clear to auscultation bilaterally, no wheezing, no crackles.  Normal respiratory effort. No accessory muscle use.  Cardiovascular: Regular rate and rhythm, no murmurs / rubs / gallops. No extremity edema. 2+ pedal pulses. No carotid bruits.  Abdomen: no tenderness, no masses palpated. No hepatosplenomegaly. Bowel sounds positive.  Musculoskeletal: no clubbing / cyanosis. No joint deformity upper and lower extremities. Good ROM, no contractures. Normal muscle tone.  Skin: no rashes, lesions, ulcers. No induration Neurologic: CN 2-12 grossly intact. Sensation intact, DTR normal. Strength 5/5 in all 4.  Psychiatric: Normal judgment and insight. Alert and oriented x 3. Normal mood.    Labs on Admission: I have personally reviewed following labs and imaging studies  CBC: Recent Labs  Lab 07/23/19 1448  WBC 8.8  NEUTROABS 7.3  HGB 14.4  HCT 43.0  MCV 84.8  PLT [id number]   Basic Metabolic Panel: Recent Labs  Lab 07/23/19 1448  NA 135  K 3.0*  CL 99  CO2 20*  GLUCOSE 193*  BUN 31*  CREATININE 3.03*  CALCIUM 8.0*   GFR: Estimated Creatinine Clearance: 38.5 mL/min (A) (by C-G formula based on SCr of 3.03 mg/dL (H)). Liver Function Tests: Recent Labs  Lab 07/23/19 1448  AST 60*  ALT 53*  ALKPHOS 76  BILITOT 0.6  PROT 8.0  ALBUMIN 3.7   No results for input(s): LIPASE, AMYLASE in the last 168 hours. No results for input(s): AMMONIA in the last 168 hours. Coagulation Profile: No results for input(s): INR, PROTIME in the last 168 hours. Cardiac Enzymes: No results for input(s): CKTOTAL, CKMB, CKMBINDEX, TROPONINI in the last 168 hours. BNP (last 3 results) No results for input(s): PROBNP in the last 8760 hours. HbA1C: No results for input(s): HGBA1C in the last 72 hours. CBG: No results for input(s): GLUCAP in the last 168 hours. Lipid Profile: No results for input(s): CHOL, HDL, LDLCALC, TRIG, CHOLHDL, LDLDIRECT in the last 72 hours. Thyroid Function Tests: No results for input(s): TSH, T4TOTAL, FREET4, T3FREE, THYROIDAB in the last  72 hours. Anemia Panel: No results for input(s): VITAMINB12, FOLATE, FERRITIN, TIBC, IRON, RETICCTPCT in the last 72 hours. Urine analysis: No results found for: COLORURINE, APPEARANCEUR, LABSPEC, PHURINE, GLUCOSEU, HGBUR, BILIRUBINUR, KETONESUR, PROTEINUR, UROBILINOGEN, NITRITE, LEUKOCYTESUR  Radiological Exams on Admission: Dg Chest 2 View  Result Date: 07/23/2019 CLINICAL DATA:  Cough and wheezing. EXAM: CHEST - 2 VIEW COMPARISON:  None. FINDINGS: The heart size and mediastinal contours are within normal limits. Pulmonary interstitial prominence bilaterally and suggestion peripheral infiltrate/opacity in the left mid to lower lung. Scarring/atelectasis at the right lung base. No overt airspace edema, pleural effusions or pneumothorax. No focal nodule. The visualized skeletal structures are unremarkable. IMPRESSION: Pulmonary interstitial prominence bilaterally and suggestion of potential peripheral infiltrate in the left mid to lower lung. This can be seen with atypical pneumonia including viral infections such as COVID-19. Electronically Signed   By: Aletta Edouard M.D.   On: 07/23/2019 14:09  EKG: Sinus tachycardia, nonspecific ST-T wave changes.  Assessment/Plan Principal Problem:   Acute respiratory failure with hypoxia ([facility]) Active Problems:   DVT (deep venous thrombosis) ([facility])   Morbid obesity ([facility])   Essential hypertension   Diabetes ([facility])   OSA on CPAP   AKI (acute kidney injury) ([place])   Hypokalemia   Acute hypoxic respiratory failure: -Likely secondary from COVID-19 i pneumonia .  Chest x-ray as above. -COVID-19 test is pending.  Patient hypoxic upon arrival: 88%-currently on 2 L of oxygen -We will try to wean off of his oxygen -Admit for close monitoring and placed under investigation -Lactic acid: WNL, patient is currently afebrile, no leukocytosis. -We will check all inflammatory markers.  Check troponin.  EKG: Shows sinus tachycardia.  Blood culture is pending.  -Start on Solu-Medrol IV twice daily -We will hold for remdesivir as of now as patient's COVID-19 test is still pending.  AKI: -No baseline BUN/creatinine in EMR.  Patient denies history of CKD -Start on IV fluids.  Monitor kidney function closely.  Avoid nephrotoxic medication -We will get renal ultrasound.  Hypertension: Blood pressure elevated upon arrival -We will continue metoprolol and hold ACE inhibitor due to worsening kidney function. -Monitor blood pressure closely.  Diabetes mellitus: Check A1c -Will hold metformin for now due to AKI -Start on sliding scale insulin and monitor blood sugar closely.  Obstructive sleep apnea on CPAP: -Family will bring his CPAP machine from home.  Elevated liver enzyme: -AST: 60, ALT: 53.  Normal albumin and total bilirubin -Likely secondary from viral infection. -Monitor for now.  History of unprovoked DVT: On Xarelto -We will continue same.  Hypokalemia: -Replenished and monitor BMP tomorrow a.m.  Hyperlipidemia: Continue statin.  Morbid obesity: With BMI of more than 40 -Needs counseling regarding dietary modification, exercise and weight loss.  DVT prophylaxis: TED/SCD/Xarelto  code Status: Full code Family Communication: None present at bedside.  Plan of care discussed with patient in length and he verbalized understanding and agreed with it. Disposition Plan: TBD Consults called: none Admission status: inpatient.   Mckinley Jewel MD [facility] Pager [phone]  If 7PM-7AM, please contact night-coverage [email or web address] Password [email or web address]  07/23/2019, 5:54 PM

## 2019-07-23 NOTE — Discharge Instructions (Signed)
To ED for further evaluation.

## 2019-07-23 NOTE — ED Triage Notes (Signed)
Pt states he has a cough x 8 days.  Pt states having generalized body aches and fever x 1 week. Pt reports he is not having appetite and he lost 20 pound in 9 days.

## 2019-07-23 NOTE — ED Notes (Signed)
ED TO INPATIENT HANDOFF REPORT  ED Nurse Name and Phone #: Theon Sobotka   S Name/Age/Gender Ronald Woodward 58 y.o. male Room/Bed: 039C/039C  Code Status   Code Status: Full Code  Home/SNF/Other Home Patient oriented to: self, place and situation Is this baseline? Yes   Triage Complete: Triage complete  Chief Complaint poss pna  Triage Note C/o congestion, SOB with O2 sats <90% at home, loss of appetite, fever, nausea, 20 lb weight loss, cough with clear phlegm, generalized body aches, and diarrhea since last Saturday.  [facility] on Wednesday with pending results.  Sent from [facility].   Allergies No Known Allergies  Level of Care/Admitting Diagnosis ED Disposition    ED Disposition Condition [facility] Area: [place] [[id number]]  Level of Care: Telemetry Medical [104]  Covid Evaluation: Person Under Investigation (PUI)  Diagnosis: Acute respiratory failure with hypoxia [facility]) [id number]  Admitting Physician: Mckinley Jewel [id number]  Attending Physician: Mckinley Jewel [phone]  Estimated length of stay: past midnight tomorrow  Certification:: I certify this patient will need inpatient services for at least 2 midnights  PT Class (Do Not Modify): Inpatient [101]  PT Acc Code (Do Not Modify): Private [1]       B Medical/Surgery History Past Medical History:  Diagnosis Date  . Diabetes ([place])   . DVT (deep venous thrombosis) ([place])   . HTN (hypertension)   . Hyperlipidemia   . Obesity   . Palpitation   . PE (physical exam), annual   . Sleep apnea    CPAP   Past Surgical History:  Procedure Laterality Date  . Athroscopic     Knee (right)  . TONSILLECTOMY AND ADENOIDECTOMY       A IV Location/Drains/Wounds Patient Lines/Drains/Airways Status   Active Line/Drains/Airways    Name:   Placement date:   Placement time:   Site:   Days:   Peripheral IV 07/23/19 Right Antecubital   07/23/19    1718     Antecubital   less than 1          Intake/Output Last 24 hours No intake or output data in the 24 hours ending 07/23/19 2255  Labs/Imaging Results for orders placed or performed during the hospital encounter of 07/23/19 (from the past 48 hour(s))  Lactic acid, plasma     Status: None   Collection Time: 07/23/19  2:48 PM  Result Value Ref Range   Lactic Acid, Venous 1.6 0.5 - 1.9 mmol/L    Comment: Performed at [facility], [address], [place], [place] [address]  Comprehensive metabolic panel     Status: Abnormal   Collection Time: 07/23/19  2:48 PM  Result Value Ref Range   Sodium 135 135 - 145 mmol/L   Potassium 3.0 (L) 3.5 - 5.1 mmol/L   Chloride 99 98 - 111 mmol/L   CO2 20 (L) 22 - 32 mmol/L   Glucose, Bld 193 (H) 70 - 99 mg/dL   BUN 31 (H) 6 - 20 mg/dL   Creatinine, Ser 3.03 (H) 0.61 - 1.24 mg/dL   Calcium 8.0 (L) 8.9 - 10.3 mg/dL   Total Protein 8.0 6.5 - 8.1 g/dL   Albumin 3.7 3.5 - 5.0 g/dL   AST 60 (H) 15 - 41 U/L   ALT 53 (H) 0 - 44 U/L   Alkaline Phosphatase 76 38 - 126 U/L   Total Bilirubin 0.6 0.3 - 1.2 mg/dL  GFR calc non Af Amer 22 (L) >60 mL/min   GFR calc Af Amer 25 (L) >60 mL/min   Anion gap 16 (H) 5 - 15    Comment: Performed at [facility] [address] [place], [place] [address]  CBC with Differential     Status: None   Collection Time: 07/23/19  2:48 PM  Result Value Ref Range   WBC 8.8 4.0 - 10.5 K/uL   RBC 5.07 4.22 - 5.81 MIL/uL   Hemoglobin 14.4 13.0 - 17.0 g/dL   HCT 43.0 39.0 - 52.0 %   MCV 84.8 80.0 - 100.0 fL   MCH 28.4 26.0 - 34.0 pg   MCHC 33.5 30.0 - 36.0 g/dL   RDW 14.2 11.5 - 15.5 %   Platelets 259 150 - 400 K/uL   nRBC 0.0 0.0 - 0.2 %   Neutrophils Relative % 83 %   Neutro Abs 7.3 1.7 - 7.7 K/uL   Lymphocytes Relative 11 %   Lymphs Abs 1.0 0.7 - 4.0 K/uL   Monocytes Relative 5 %   Monocytes Absolute 0.4 0.1 - 1.0 K/uL   Eosinophils Relative 0 %   Eosinophils Absolute 0.0 0.0 - 0.5 K/uL   Basophils  Relative 0 %   Basophils Absolute 0.0 0.0 - 0.1 K/uL   Immature Granulocytes 1 %   Abs Immature Granulocytes 0.05 0.00 - 0.07 K/uL    Comment: Performed at [facility], [address] [place], [place] [address]  ABO/Rh     Status: None   Collection Time: 07/23/19  5:29 PM  Result Value Ref Range   ABO/RH(D)      A POS Performed at [facility] [address] [place], [place] [address]   HIV Antibody (routine testing w rflx)     Status: None   Collection Time: 07/23/19  5:53 PM  Result Value Ref Range   HIV Screen 4th Generation wRfx NON REACTIVE NON REACTIVE    Comment: Performed at [facility], [place] [address], [place], [place] [address]  Brain natriuretic peptide     Status: None   Collection Time: 07/23/19  5:53 PM  Result Value Ref Range   B Natriuretic Peptide 21.1 0.0 - 100.0 pg/mL    Comment: Performed at [facility] [address], [place], [place] [address]  C-reactive protein     Status: Abnormal   Collection Time: 07/23/19  5:53 PM  Result Value Ref Range   CRP 10.0 (H) <1.0 mg/dL    Comment: Performed at [facility], [place] [address], [place], [place] [address]  D-dimer, quantitative (not at [facility])     Status: Abnormal   Collection Time: 07/23/19  5:53 PM  Result Value Ref Range   D-Dimer, Quant 0.68 (H) 0.00 - 0.50 ug/mL-FEU    Comment: (NOTE) At the manufacturer cut-off of 0.50 ug/mL FEU, this assay has been documented to exclude PE with a sensitivity and negative predictive value of 97 to 99%.  At this time, this assay has not been approved by the FDA to exclude DVT/VTE. Results should be correlated with clinical presentation. Performed at [facility], [place] [address], [place], [place] [address]   Ferritin     Status: Abnormal   Collection Time: 07/23/19  5:53 PM  Result Value Ref Range   Ferritin 614 (H) 24 - 336 ng/mL    Comment: Performed at [facility], [place]  [address], [place], [place] [address]  Fibrinogen      Status: Abnormal   Collection Time: 07/23/19  5:53 PM  Result Value Ref Range   Fibrinogen 577 (H) 210 - 475 mg/dL    Comment: Performed at [facility] [address], [place], [place] [address]  Hepatitis B surface antigen     Status: None   Collection Time: 07/23/19  5:53 PM  Result Value Ref Range   Hepatitis B Surface Ag NON REACTIVE NON REACTIVE    Comment: Performed at [facility] [address], [place], [place] [address]  Lactate dehydrogenase     Status: Abnormal   Collection Time: 07/23/19  5:53 PM  Result Value Ref Range   LDH 369 (H) 98 - 192 U/L    Comment: Performed at [facility], [place] [address], [place], [place] [address]  Procalcitonin     Status: None   Collection Time: 07/23/19  5:53 PM  Result Value Ref Range   Procalcitonin <0.10 ng/mL    Comment:        Interpretation: PCT (Procalcitonin) <= 0.5 ng/mL: Systemic infection (sepsis) is not likely. Local bacterial infection is possible. (NOTE)       Sepsis PCT Algorithm           Lower Respiratory Tract                                      Infection PCT Algorithm    ----------------------------     ----------------------------         PCT < 0.25 ng/mL                PCT < 0.10 ng/mL         Strongly encourage             Strongly discourage   discontinuation of antibiotics    initiation of antibiotics    ----------------------------     -----------------------------       PCT 0.25 - 0.50 ng/mL            PCT 0.10 - 0.25 ng/mL               OR       >80% decrease in PCT            Discourage initiation of                                            antibiotics      Encourage discontinuation           of antibiotics    ----------------------------     -----------------------------         PCT >= 0.50 ng/mL              PCT 0.26 - 0.50 ng/mL               AND        <80% decrease in PCT             Encourage initiation of  antibiotics        Encourage continuation           of antibiotics    ----------------------------     -----------------------------        PCT >= 0.50 ng/mL                  PCT > 0.50 ng/mL               AND         increase in PCT                  Strongly encourage                                      initiation of antibiotics    Strongly encourage escalation           of antibiotics                                     -----------------------------                                           PCT <= 0.25 ng/mL                                                 OR                                        > 80% decrease in PCT                                     Discontinue / Do not initiate                                             antibiotics Performed at [facility], [address], [place], [place] [address]   Troponin I (High Sensitivity)     Status: Abnormal   Collection Time: 07/23/19  5:53 PM  Result Value Ref Range   Troponin I (High Sensitivity) 19 (H) <18 ng/L    Comment: (NOTE) Elevated high sensitivity troponin I (hsTnI) values and significant  changes across serial measurements may suggest ACS but many other  chronic and acute conditions are known to elevate hsTnI results.  Refer to the "Links" section for chest pain algorithms and additional  guidance. Performed at [facility], [place] [address], [place], [place] [address]   Hemoglobin A1c     Status: Abnormal   Collection Time: 07/23/19  5:53 PM  Result Value Ref Range   Hgb A1c MFr Bld 7.8 (H) 4.8 - 5.6 %    Comment: (NOTE) Pre diabetes:          5.7%-6.4% Diabetes:              >  6.4% Glycemic control for   <7.0% adults with diabetes    Mean Plasma Glucose 177.16 mg/dL    Comment: Performed at [facility] [address] [place], [place] [address]  Lipid panel     Status: Abnormal   Collection Time: 07/23/19  5:53 PM  Result Value Ref Range   Cholesterol 101 0 - 200 mg/dL   Triglycerides 159 (H) <150 mg/dL    HDL 21 (L) >40 mg/dL   Total CHOL/HDL Ratio 4.8 RATIO   VLDL 32 0 - 40 mg/dL   LDL Cholesterol 48 0 - 99 mg/dL    Comment:        Total Cholesterol/HDL:CHD Risk Coronary Heart Disease Risk Table                     Men   Women  1/2 Average Risk   3.4   3.3  Average Risk       5.0   4.4  2 X Average Risk   9.6   7.1  3 X Average Risk  23.4   11.0        Use the calculated Patient Ratio above and the CHD Risk Table to determine the patient's CHD Risk.        ATP III CLASSIFICATION (LDL):  <100     mg/dL   Optimal  100-129  mg/dL   Near or Above                    Optimal  130-159  mg/dL   Borderline  160-189  mg/dL   High  >190     mg/dL   Very High Performed at [facility] [address], [place],  [address]   CBG monitoring, ED     Status: Abnormal   Collection Time: 07/23/19 10:20 PM  Result Value Ref Range   Glucose-Capillary 241 (H) 70 - 99 mg/dL   Dg Chest 2 View  Result Date: 07/23/2019 CLINICAL DATA:  Cough and wheezing. EXAM: CHEST - 2 VIEW COMPARISON:  None. FINDINGS: The heart size and mediastinal contours are within normal limits. Pulmonary interstitial prominence bilaterally and suggestion peripheral infiltrate/opacity in the left mid to lower lung. Scarring/atelectasis at the right lung base. No overt airspace edema, pleural effusions or pneumothorax. No focal nodule. The visualized skeletal structures are unremarkable. IMPRESSION: Pulmonary interstitial prominence bilaterally and suggestion of potential peripheral infiltrate in the left mid to lower lung. This can be seen with atypical pneumonia including viral infections such as COVID-19. Electronically Signed   By: Aletta Edouard M.D.   On: 07/23/2019 14:09    Pending Labs Unresulted Labs (From admission, onward)    Start     Ordered   07/24/19 0500  CBC with Differential/Platelet  Daily,   R     07/23/19 1733   07/24/19 0500  Comprehensive metabolic panel  Daily,   R     07/23/19 1733    07/24/19 0500  C-reactive protein  Daily,   R     07/23/19 1733   07/24/19 0500  D-dimer, quantitative (not at [facility])  Daily,   R     07/23/19 1733   07/24/19 0500  Ferritin  Daily,   R     07/23/19 1733   07/24/19 0500  Magnesium  Daily,   R     07/23/19 1733   07/24/19 0500  Phosphorus  Daily,   R     07/23/19  1733   07/23/19 1732  Culture, blood (Routine X 2) w Reflex to ID Panel  BLOOD CULTURE X 2,   R (with STAT occurrences)     07/23/19 1733          Vitals/Pain Today's Vitals   07/23/19 1930 07/23/19 2000 07/23/19 2100 07/23/19 2222  BP:  (!) 144/73 (!) 154/89 139/67  Pulse:  88 92 87  Resp:  (!) 21 (!) 21 20  Temp:      TempSrc:      SpO2:  93% 94% 94%  Weight:      Height:      PainSc: 0-No pain       Isolation Precautions Airborne and Contact precautions  Medications Medications  sodium chloride flush (NS) 0.9 % injection 3 mL (3 mLs Intravenous Not Given 07/23/19 1503)  0.9 %  sodium chloride infusion ( Intravenous New Bag/Given 07/23/19 1813)  methylPREDNISolone sodium succinate (SOLU-MEDROL) 125 mg/2 mL injection 68.125 mg (68.125 mg Intravenous Given 07/23/19 1811)  guaiFENesin-dextromethorphan (ROBITUSSIN DM) 100-10 MG/5ML syrup 10 mL (has no administration in time range)  chlorpheniramine-HYDROcodone (TUSSIONEX) 10-8 MG/5ML suspension 5 mL (has no administration in time range)  vitamin C (ASCORBIC ACID) tablet 500 mg (500 mg Oral Given 07/23/19 1814)  zinc sulfate capsule 220 mg (220 mg Oral Given 07/23/19 1913)  acetaminophen (TYLENOL) tablet 650 mg (has no administration in time range)  ondansetron (ZOFRAN) tablet 4 mg (has no administration in time range)    Or  ondansetron (ZOFRAN) injection 4 mg (has no administration in time range)  insulin aspart (novoLOG) injection 0-15 Units (has no administration in time range)  insulin aspart (novoLOG) injection 0-5 Units (2 Units Subcutaneous Given 07/23/19 2223)  potassium chloride (KLOR-CON) packet 40 mEq  (40 mEq Oral Given 07/23/19 2217)  atorvastatin (LIPITOR) tablet 20 mg (20 mg Oral Given 07/23/19 2216)  metoprolol succinate (TOPROL-XL) 24 hr tablet 50 mg (50 mg Oral Given 07/23/19 2216)  albuterol (PROVENTIL) (2.5 MG/3ML) 0.083% nebulizer solution 2.5 mg (has no administration in time range)  rivaroxaban (XARELTO) tablet 10 mg (10 mg Oral Given 07/23/19 2226)  potassium chloride SA (KLOR-CON) CR tablet 40 mEq (40 mEq Oral Given 07/23/19 1719)  dexamethasone (DECADRON) injection 10 mg (10 mg Intravenous Given 07/23/19 1720)  albuterol (VENTOLIN HFA) 108 (90 Base) MCG/ACT inhaler 6 puff (6 puffs Inhalation Given 07/23/19 1722)    Mobility walks Low fall risk   Focused Assessments Pulmonary Assessment Handoff:  Lung sounds: Bilateral Breath Sounds: Clear O2 Device: Nasal Cannula O2 Flow Rate (L/min): 2 L/min      R Recommendations: See Admitting Provider Note  Report given to:   Additional Notes:

## 2019-07-23 NOTE — ED Provider Notes (Addendum)
[facility]    CSN: [id number] Arrival date & time: 07/23/19  1303      History   Chief Complaint Chief Complaint  Patient presents with  . Fever  . Cough  . Generalized Body Aches  . Weight Loss  . Anorexia    HPI Ronald Woodward is a 58 y.o. male history of hypertension, hyperlipidemia, DM type II, OSA, previous DVT on Xarelto presenting today for evaluation of cough.  Patient has had a cough and felt under the weather for the past 8 days.  He has had associated body aches, chills, subjective fevers throughout the week.  He has had very poor appetite.  Notes that he has lost 20 pounds since onset of his symptoms.  Occasionally will feel short of breath with coughing spells.  Has had nausea and some dull abdominal pain.  Bowels have been watery, but not frequent.  Denies any known exposure to Covid.  [facility], but has not had results yet.  HPI  Past Medical History:  Diagnosis Date  . Diabetes ([place])   . DVT (deep venous thrombosis) ([place])   . HTN (hypertension)   . Hyperlipidemia   . Obesity   . Palpitation   . PE (physical exam), annual   . Sleep apnea    CPAP    Patient Active Problem List   Diagnosis Date Noted  . Essential hypertension 07/30/2017  . Morbid obesity ([place]) 04/29/2017  . SOB (shortness of breath) 04/29/2017  . Palpitation 07/07/2014  . DVT (deep vein thrombosis) in pregnancy 07/07/2014    Past Surgical History:  Procedure Laterality Date  . Athroscopic     Knee (right)  . TONSILLECTOMY AND ADENOIDECTOMY         Home Medications    Prior to Admission medications   Medication Sig Start Date End Date Taking? Authorizing Provider  atorvastatin (LIPITOR) 20 MG tablet Take 20 mg by mouth daily.    [provider]  chlorthalidone (HYGROTON) 25 MG tablet TAKE 1 TABLET (25 MG TOTAL) BY MOUTH DAILY. 05/04/18   Minus Breeding, MD  Glucosamine Sulfate 500 MG CAPS Take by mouth.    [provider]   lisinopril (PRINIVIL,ZESTRIL) 20 MG tablet Take 20 mg by mouth daily.    [provider]  loratadine (CLARITIN) 10 MG tablet Take by mouth.    [provider]  losartan-hydrochlorothiazide (HYZAAR) 100-25 MG tablet Take 1 tablet by mouth daily. 03/29/19   [provider]  metformin (FORTAMET) 1000 MG (OSM) 24 hr tablet Take 1,000 mg by mouth daily with breakfast.    [provider]  metoprolol succinate (TOPROL-XL) 50 MG 24 hr tablet TAKE 1 TABLET (50 MG TOTAL) BY MOUTH DAILY. 05/04/18   Minus Breeding, MD  rivaroxaban (XARELTO) 20 MG TABS tablet Take 20 mg by mouth daily with supper.    [provider]    Family History Family History  Problem Relation Age of Onset  . Clotting disorder Father   . Hypertension Mother     Social History Social History   Tobacco Use  . Smoking status: Former Smoker    Packs/day: 0.50    Years: 10.00    Pack years: 5.00    Types: Cigarettes  . Smokeless tobacco: Former Systems developer    Quit date: 06/22/2013  Substance Use Topics  . Alcohol use: No  . Drug use: No     Allergies   Patient has no known allergies.  Review of Systems Review of Systems  Constitutional: Positive for appetite change, chills, fatigue and fever. Negative for activity change.  HENT: Positive for congestion and rhinorrhea. Negative for ear pain, sinus pressure, sore throat and trouble swallowing.   Eyes: Negative for discharge and redness.  Respiratory: Positive for cough. Negative for chest tightness and shortness of breath.   Cardiovascular: Negative for chest pain.  Gastrointestinal: Negative for abdominal pain, diarrhea, nausea and vomiting.  Musculoskeletal: Positive for myalgias.  Skin: Negative for rash.  Neurological: Negative for dizziness, light-headedness and headaches.     Physical Exam Triage Vital Signs ED Triage Vitals  Enc Vitals Group     BP 07/23/19 1329 138/79     Pulse Rate 07/23/19 1329 (!) 106     Resp  07/23/19 1329 18     Temp 07/23/19 1329 98.5 F (36.9 C)     Temp Source 07/23/19 1329 Oral     SpO2 07/23/19 1329 94 %     Weight --      Height --      Head Circumference --      Peak Flow --      Pain Score 07/23/19 1324 5     Pain Loc --      Pain Edu? --      Excl. in [place]? --    No data found.  Updated Vital Signs BP 138/79 (BP Location: Left Arm)   Pulse (!) 112   Temp 98.5 F (36.9 C) (Oral)   Resp 18   SpO2 (!) 88% Comment: Ambulation  Visual Acuity Right Eye Distance:   Left Eye Distance:   Bilateral Distance:    Right Eye Near:   Left Eye Near:    Bilateral Near:     Physical Exam Vitals signs and nursing note reviewed.  Constitutional:      Appearance: He is well-developed.  HENT:     Head: Normocephalic and atraumatic.     Ears:     Comments: Bilateral ears without tenderness to palpation of external auricle, tragus and mastoid, EAC's without erythema or swelling, TM's with good bony landmarks and cone of light. Non erythematous.     Nose:     Comments: Nasal mucosa pink, no rhinorrhea present    Mouth/Throat:     Comments: Oral mucosa pink and moist, no tonsillar enlargement or exudate. Posterior pharynx patent and nonerythematous, no uvula deviation or swelling. Normal phonation. Eyes:     Conjunctiva/sclera: Conjunctivae normal.  Neck:     Musculoskeletal: Neck supple.  Cardiovascular:     Rate and Rhythm: Regular rhythm. Tachycardia present.     Heart sounds: No murmur.  Pulmonary:     Effort: Pulmonary effort is normal. No respiratory distress.     Breath sounds: Rales present.     Comments: Crackles noted to bilateral lower lung fields Abdominal:     Palpations: Abdomen is soft.     Tenderness: There is no abdominal tenderness.  Skin:    General: Skin is warm and dry.  Neurological:     Mental Status: He is alert.      UC Treatments / Results  Labs (all labs ordered are listed, but only abnormal results are displayed) Labs  Reviewed  NOVEL CORONAVIRUS, NAA (HOSP ORDER, SEND-OUT TO REF LAB; TAT 18-24 HRS)    EKG   Radiology Dg Chest 2 View  Result Date: 07/23/2019 CLINICAL DATA:  Cough and wheezing. EXAM: CHEST - 2 VIEW COMPARISON:  None. FINDINGS: The  heart size and mediastinal contours are within normal limits. Pulmonary interstitial prominence bilaterally and suggestion peripheral infiltrate/opacity in the left mid to lower lung. Scarring/atelectasis at the right lung base. No overt airspace edema, pleural effusions or pneumothorax. No focal nodule. The visualized skeletal structures are unremarkable. IMPRESSION: Pulmonary interstitial prominence bilaterally and suggestion of potential peripheral infiltrate in the left mid to lower lung. This can be seen with atypical pneumonia including viral infections such as COVID-19. Electronically Signed   By: Aletta Edouard M.D.   On: 07/23/2019 14:09    Procedures Procedures (including critical care time)  Medications Ordered in UC Medications - No data to display  Initial Impression / Assessment and Plan / UC Course  I have reviewed the triage vital signs and the nursing notes.  Pertinent labs & imaging results that were available during my care of the patient were reviewed by me and considered in my medical decision making (see chart for details).     X-ray consistent with atypical pneumonia.  Oxygen averaging 91/92% at rest, drops to 88-89% with ambulation.  Given this recommending further evaluation in emergency room and possible admission given hypoxia. Discussed with Dr. Lanny Cramp who is agreeable with disposition.   Patient stable on discharge, transported by nursing staff via wheelchair.   Final Clinical Impressions(s) / UC Diagnoses   Final diagnoses:  Atypical pneumonia     Discharge Instructions     To ED for further evaluation    ED Prescriptions    None     PDMP not reviewed this encounter.   Joneen Caraway [place] C, PA-C 07/23/19 1422     Debara Pickett C, PA-C 07/23/19 1426

## 2019-07-23 NOTE — ED Notes (Signed)
Tele, paged admitting Dr to [facility]

## 2019-07-24 ENCOUNTER — Inpatient Hospital Stay (HOSPITAL_COMMUNITY): Payer: 59

## 2019-07-24 DIAGNOSIS — G4733 Obstructive sleep apnea (adult) (pediatric): Secondary | ICD-10-CM

## 2019-07-24 DIAGNOSIS — N179 Acute kidney failure, unspecified: Secondary | ICD-10-CM

## 2019-07-24 DIAGNOSIS — I10 Essential (primary) hypertension: Secondary | ICD-10-CM

## 2019-07-24 DIAGNOSIS — Z9989 Dependence on other enabling machines and devices: Secondary | ICD-10-CM

## 2019-07-24 LAB — COMPREHENSIVE METABOLIC PANEL
ALT: 44 U/L (ref 0–44)
AST: 49 U/L — ABNORMAL HIGH (ref 15–41)
Albumin: 3.2 g/dL — ABNORMAL LOW (ref 3.5–5.0)
Alkaline Phosphatase: 72 U/L (ref 38–126)
Anion gap: 13 (ref 5–15)
BUN: 36 mg/dL — ABNORMAL HIGH (ref 6–20)
CO2: 21 mmol/L — ABNORMAL LOW (ref 22–32)
Calcium: 7.9 mg/dL — ABNORMAL LOW (ref 8.9–10.3)
Chloride: 102 mmol/L (ref 98–111)
Creatinine, Ser: 2.49 mg/dL — ABNORMAL HIGH (ref 0.61–1.24)
GFR calc Af Amer: 32 mL/min — ABNORMAL LOW (ref >60)
GFR calc non Af Amer: 28 mL/min — ABNORMAL LOW (ref >60)
Glucose, Bld: 332 mg/dL — ABNORMAL HIGH (ref 70–99)
Potassium: 3.6 mmol/L (ref 3.5–5.1)
Sodium: 136 mmol/L (ref 135–145)
Total Bilirubin: 0.6 mg/dL (ref 0.3–1.2)
Total Protein: 7.3 g/dL (ref 6.5–8.1)

## 2019-07-24 LAB — CBC WITH DIFFERENTIAL/PLATELET
Abs Immature Granulocytes: 0 10*3/uL (ref 0.00–0.07)
Basophils Absolute: 0 10*3/uL (ref 0.0–0.1)
Basophils Relative: 0 %
Eosinophils Absolute: 0 10*3/uL (ref 0.0–0.5)
Eosinophils Relative: 0 %
HCT: 41.1 % (ref 39.0–52.0)
Hemoglobin: 13.6 g/dL (ref 13.0–17.0)
Lymphocytes Relative: 9 %
Lymphs Abs: 0.5 10*3/uL — ABNORMAL LOW (ref 0.7–4.0)
MCH: 28.3 pg (ref 26.0–34.0)
MCHC: 33.1 g/dL (ref 30.0–36.0)
MCV: 85.6 fL (ref 80.0–100.0)
Monocytes Absolute: 0.2 10*3/uL (ref 0.1–1.0)
Monocytes Relative: 4 %
Neutro Abs: 4.5 10*3/uL (ref 1.7–7.7)
Neutrophils Relative %: 87 %
Platelets: 288 10*3/uL (ref 150–400)
RBC: 4.8 MIL/uL (ref 4.22–5.81)
RDW: 14.1 % (ref 11.5–15.5)
WBC: 5.2 10*3/uL (ref 4.0–10.5)
nRBC: 0 % (ref 0.0–0.2)
nRBC: 0 /100 WBC

## 2019-07-24 LAB — TROPONIN I (HIGH SENSITIVITY): Troponin I (High Sensitivity): 14 ng/L (ref <18)

## 2019-07-24 LAB — GLUCOSE, CAPILLARY
Glucose-Capillary: 271 mg/dL — ABNORMAL HIGH (ref 70–99)
Glucose-Capillary: 278 mg/dL — ABNORMAL HIGH (ref 70–99)
Glucose-Capillary: 324 mg/dL — ABNORMAL HIGH (ref 70–99)
Glucose-Capillary: 373 mg/dL — ABNORMAL HIGH (ref 70–99)
Glucose-Capillary: 376 mg/dL — ABNORMAL HIGH (ref 70–99)

## 2019-07-24 LAB — NOVEL CORONAVIRUS, NAA (HOSP ORDER, SEND-OUT TO REF LAB; TAT 18-24 HRS): SARS-CoV-2, NAA: NOT DETECTED

## 2019-07-24 LAB — PHOSPHORUS: Phosphorus: 2.1 mg/dL — ABNORMAL LOW (ref 2.5–4.6)

## 2019-07-24 LAB — D-DIMER, QUANTITATIVE: D-Dimer, Quant: 0.81 ug/mL-FEU — ABNORMAL HIGH (ref 0.00–0.50)

## 2019-07-24 LAB — MAGNESIUM: Magnesium: 2.3 mg/dL (ref 1.7–2.4)

## 2019-07-24 LAB — C-REACTIVE PROTEIN: CRP: 9.3 mg/dL — ABNORMAL HIGH (ref <1.0)

## 2019-07-24 LAB — FERRITIN: Ferritin: 659 ng/mL — ABNORMAL HIGH (ref 24–336)

## 2019-07-24 MED ORDER — IPRATROPIUM-ALBUTEROL 20-100 MCG/ACT IN AERS
1.0000 | INHALATION_SPRAY | Freq: Four times a day (QID) | RESPIRATORY_TRACT | Status: DC
  Administered 2019-07-24: 1 via RESPIRATORY_TRACT
  Filled 2019-07-24: qty 4

## 2019-07-24 NOTE — Progress Notes (Signed)
PROGRESS NOTE  Ronald Woodward [id number] DOB: 1961-09-02 DOA: 07/23/2019 PCP: System, Provider Not In  HPI/Recap of past 24 hours: HPI from Dr Doristine Bosworth Lucianne Muss is a 58 y.o. male with medical history significant of morbid obesity with BMI of 40, hypertension, diabetes mellitus, obstructive sleep apnea on CPAP, hyperlipidemia, unprovoked DVT-on Xarelto for many years, presents to emergency department due to worsening of shortness of breath, subjective fever, chills, cough since 4 to 5 days.  Reports dry cough, decreased appetite, generalized body ache, loose stool, nausea, weakness.  [facility], however has not had results back yet. Pt is compliant with his Xarelto. In the ED, patient was tachycardic, hypoxic with oxygen saturation of 88%-placed on 2 L of oxygen via nasal cannula.  CBC: No leukocytosis.  CMP shows potassium of 3.0, AKI, lactiCOVID-19 negative, chest x-ray suggestive of atypical pneumonia including viral infection such as COVID-19.    Today, patient still reports dry cough, still feels subjective shortness of breath, denies any nausea/vomiting, fever/chills.    Assessment/Plan: Principal Problem:   Acute respiratory failure with hypoxia (HCC) Active Problems:   DVT (deep venous thrombosis) (HCC)   Morbid obesity (HCC)   Essential hypertension   Diabetes (HCC)   OSA on CPAP   AKI (acute kidney injury) ([place])   Hypokalemia  Acute hypoxic respiratory failure/COVID-19 rule out Currently afebrile, with no leukocytosis Currently saturating above 90% on room air COVID-19 pending Inflammatory markers elevated D-dimer 0.68-0.81 BNP 21 Procalcitonin negative BC x2 pending Chest x-ray showed pulmonary interstitial prominence bilaterally Started on Solu-Medrol, cough suppressant Will start remdesivir if Covid test is positive Trend inflammatory markers Supplemental oxygen as needed  AKI Likely due to above Last creatinine  on care everywhere was 0.8 on 03/29/2019 Creatinine 3.03 on presentation, now down to 2.49 Renal ultrasound unremarkable Continue IV fluids for now Daily CMP  Transaminitis Elevated AST, ALT, currently improving Likely due to viral infection Trend LFTs  Hypertension BP stable Continue metoprolol, continue to hold ACE inhibitor due to AKI  Diabetes mellitus type 2 A1c was 7.8 on 07/23/2019 SSI, Accu-Cheks, hypoglycemic protocol Hold Metformin  OSA On CPAP at home If Covid positive, would need oxygen while sleeping  History of unprovoked DVT Continue Xarelto for now  Hyperlipidemia Continue statin  Morbid obesity BMI 40.82 Lifestyle modification advised         Malnutrition Type:      Malnutrition Characteristics:      Nutrition Interventions:       Estimated body mass index is 40.82 kg/m as calculated from the following:   Height as of this encounter: 6' (1.829 m).   Weight as of this encounter: 136.5 kg.     Code Status: Full  Family Communication: None at bedside  Disposition Plan: To be determined   Consultants:  None  Procedures:  None  Antimicrobials:  None  DVT prophylaxis: Xarelto   Objective: Vitals:   07/23/19 2222 07/23/19 2301 07/23/19 2343 07/24/19 0800  BP: 139/67  138/68 137/80  Pulse: 87  80 69  Resp: 20   20  Temp:  98.4 F (36.9 C) 97.9 F (36.6 C) 97.7 F (36.5 C)  TempSrc:  Oral Oral Oral  SpO2: 94%  93% 93%  Weight:   (!) 136.5 kg   Height:   6' (1.829 m)     Intake/Output Summary (Last 24 hours) at 07/24/2019 1130 Last data filed at 07/24/2019 1000 Gross per 24 hour  Intake 480 ml  Output 500 ml  Net -20 ml   Filed Weights   07/23/19 1459 07/23/19 2343  Weight: (!) 136.5 kg (!) 136.5 kg    Exam:  General: NAD  Cardiovascular: S1, S2 present  Respiratory:  Diminished breath sounds bilaterally at the bases  Abdomen: Soft, nontender, nondistended, bowel sounds present   Musculoskeletal: No bilateral pedal edema noted  Skin: Normal  Psychiatry: Normal mood   Data Reviewed: CBC: Recent Labs  Lab 07/23/19 1448 07/24/19 0506  WBC 8.8 5.2  NEUTROABS 7.3 4.5  HGB 14.4 13.6  HCT 43.0 41.1  MCV 84.8 85.6  PLT 259 [id number]   Basic Metabolic Panel: Recent Labs  Lab 07/23/19 1448 07/24/19 0506  NA 135 136  K 3.0* 3.6  CL 99 102  CO2 20* 21*  GLUCOSE 193* 332*  BUN 31* 36*  CREATININE 3.03* 2.49*  CALCIUM 8.0* 7.9*  MG  --  2.3  PHOS  --  2.1*   GFR: Estimated Creatinine Clearance: 46.9 mL/min (A) (by C-G formula based on SCr of 2.49 mg/dL (H)). Liver Function Tests: Recent Labs  Lab 07/23/19 1448 07/24/19 0506  AST 60* 49*  ALT 53* 44  ALKPHOS 76 72  BILITOT 0.6 0.6  PROT 8.0 7.3  ALBUMIN 3.7 3.2*   No results for input(s): LIPASE, AMYLASE in the last 168 hours. No results for input(s): AMMONIA in the last 168 hours. Coagulation Profile: No results for input(s): INR, PROTIME in the last 168 hours. Cardiac Enzymes: No results for input(s): CKTOTAL, CKMB, CKMBINDEX, TROPONINI in the last 168 hours. BNP (last 3 results) No results for input(s): PROBNP in the last 8760 hours. HbA1C: Recent Labs    07/23/19 1753  HGBA1C 7.8*   CBG: Recent Labs  Lab 07/23/19 2220 07/24/19 0030 07/24/19 0728 07/24/19 1128  GLUCAP 241* 373* 278* 376*   Lipid Profile: Recent Labs    07/23/19 1753  CHOL 101  HDL 21*  LDLCALC 48  TRIG 159*  CHOLHDL 4.8   Thyroid Function Tests: No results for input(s): TSH, T4TOTAL, FREET4, T3FREE, THYROIDAB in the last 72 hours. Anemia Panel: Recent Labs    07/23/19 1753 07/24/19 0506  FERRITIN 614* 659*   Urine analysis: No results found for: COLORURINE, APPEARANCEUR, LABSPEC, PHURINE, GLUCOSEU, HGBUR, BILIRUBINUR, KETONESUR, PROTEINUR, UROBILINOGEN, NITRITE, LEUKOCYTESUR Sepsis Labs: @LABRCNTIP (procalcitonin:4,lacticidven:4)  ) Recent Results (from the past 240 hour(s))  Culture, blood  (Routine X 2) w Reflex to ID Panel     Status: None (Preliminary result)   Collection Time: 07/23/19  5:32 PM   Specimen: BLOOD  Result Value Ref Range Status   Specimen Description BLOOD RIGHT ANTECUBITAL  Final   Special Requests   Final    BOTTLES DRAWN AEROBIC AND ANAEROBIC Blood Culture adequate volume   Culture   Final    NO GROWTH < 12 HOURS Performed at [facility], [address], [place], [place] [address]    Report Status PENDING  Incomplete  Culture, blood (Routine X 2) w Reflex to ID Panel     Status: None (Preliminary result)   Collection Time: 07/23/19  5:37 PM   Specimen: BLOOD  Result Value Ref Range Status   Specimen Description BLOOD LEFT ANTECUBITAL  Final   Special Requests   Final    BOTTLES DRAWN AEROBIC ONLY Blood Culture results may not be optimal due to an inadequate volume of blood received in culture bottles   Culture   Final    NO  GROWTH < 12 HOURS Performed at [facility] [address] [place], [place] [address]    Report Status PENDING  Incomplete      Studies: Dg Chest 2 View  Result Date: 07/23/2019 CLINICAL DATA:  Cough and wheezing. EXAM: CHEST - 2 VIEW COMPARISON:  None. FINDINGS: The heart size and mediastinal contours are within normal limits. Pulmonary interstitial prominence bilaterally and suggestion peripheral infiltrate/opacity in the left mid to lower lung. Scarring/atelectasis at the right lung base. No overt airspace edema, pleural effusions or pneumothorax. No focal nodule. The visualized skeletal structures are unremarkable. IMPRESSION: Pulmonary interstitial prominence bilaterally and suggestion of potential peripheral infiltrate in the left mid to lower lung. This can be seen with atypical pneumonia including viral infections such as COVID-19. Electronically Signed   By: Aletta Edouard M.D.   On: 07/23/2019 14:09   US Renal  Result Date: 07/24/2019 CLINICAL DATA:  Acute kidney insufficiency. EXAM: RENAL / URINARY  TRACT ULTRASOUND COMPLETE COMPARISON:  None. FINDINGS: Right Kidney: Renal measurements: 12.0 x 6.0 x 4.9 cm = volume: 183 mL . Echogenicity within normal limits. No mass or hydronephrosis visualized. Left Kidney: Renal measurements: 12.7 x 6.4 x 5.5 cm = volume: 231 mL. Echogenicity within normal limits. No mass or hydronephrosis visualized. Bladder: Appears normal for degree of bladder distention. IMPRESSION: Normal bilateral renal ultrasound. No acute or focal lesion to explain renal insufficiency. Electronically Signed   By: San Morelle M.D.   On: 07/24/2019 10:37    Scheduled Meds: . atorvastatin  20 mg Oral Daily  . insulin aspart  0-15 Units Subcutaneous TID WC  . insulin aspart  0-5 Units Subcutaneous QHS  . methylPREDNISolone (SOLU-MEDROL) injection  0.5 mg/kg Intravenous Q12H  . metoprolol succinate  50 mg Oral Daily  . rivaroxaban  10 mg Oral Q supper  . sodium chloride flush  3 mL Intravenous Once  . vitamin C  500 mg Oral Daily  . zinc sulfate  220 mg Oral Daily    Continuous Infusions: . sodium chloride 100 mL/hr at 07/24/19 1000     LOS: 1 day     Alma Friendly, MD [facility]  If 7PM-7AM, please contact night-coverage [email or web address] 07/24/2019, 11:30 AM

## 2019-07-25 DIAGNOSIS — E1165 Type 2 diabetes mellitus with hyperglycemia: Secondary | ICD-10-CM | POA: Diagnosis not present

## 2019-07-25 DIAGNOSIS — U071 COVID-19: Secondary | ICD-10-CM | POA: Diagnosis not present

## 2019-07-25 DIAGNOSIS — J9601 Acute respiratory failure with hypoxia: Secondary | ICD-10-CM | POA: Diagnosis not present

## 2019-07-25 DIAGNOSIS — I10 Essential (primary) hypertension: Secondary | ICD-10-CM | POA: Diagnosis not present

## 2019-07-25 DIAGNOSIS — Z6841 Body Mass Index (BMI) 40.0 and over, adult: Secondary | ICD-10-CM | POA: Diagnosis not present

## 2019-07-25 DIAGNOSIS — R7401 Elevation of levels of liver transaminase levels: Secondary | ICD-10-CM | POA: Diagnosis not present

## 2019-07-25 DIAGNOSIS — J1289 Other viral pneumonia: Secondary | ICD-10-CM | POA: Diagnosis not present

## 2019-07-25 DIAGNOSIS — N179 Acute kidney failure, unspecified: Secondary | ICD-10-CM | POA: Diagnosis not present

## 2019-07-25 DIAGNOSIS — E876 Hypokalemia: Secondary | ICD-10-CM | POA: Diagnosis not present

## 2019-07-25 LAB — CBC WITH DIFFERENTIAL/PLATELET
Abs Immature Granulocytes: 0 10*3/uL (ref 0.00–0.07)
Basophils Absolute: 0.2 10*3/uL — ABNORMAL HIGH (ref 0.0–0.1)
Basophils Relative: 1 %
Eosinophils Absolute: 0 10*3/uL (ref 0.0–0.5)
Eosinophils Relative: 0 %
HCT: 38.3 % — ABNORMAL LOW (ref 39.0–52.0)
Hemoglobin: 12.8 g/dL — ABNORMAL LOW (ref 13.0–17.0)
Lymphocytes Relative: 2 %
Lymphs Abs: 0.3 10*3/uL — ABNORMAL LOW (ref 0.7–4.0)
MCH: 28.6 pg (ref 26.0–34.0)
MCHC: 33.4 g/dL (ref 30.0–36.0)
MCV: 85.7 fL (ref 80.0–100.0)
Monocytes Absolute: 0.6 10*3/uL (ref 0.1–1.0)
Monocytes Relative: 4 %
Neutro Abs: 14.1 10*3/uL — ABNORMAL HIGH (ref 1.7–7.7)
Neutrophils Relative %: 93 %
Platelets: 325 10*3/uL (ref 150–400)
RBC: 4.47 MIL/uL (ref 4.22–5.81)
RDW: 14.2 % (ref 11.5–15.5)
WBC: 15.2 10*3/uL — ABNORMAL HIGH (ref 4.0–10.5)
nRBC: 0 % (ref 0.0–0.2)
nRBC: 0 /100 WBC

## 2019-07-25 LAB — GLUCOSE, CAPILLARY
Glucose-Capillary: 331 mg/dL — ABNORMAL HIGH (ref 70–99)
Glucose-Capillary: 322 mg/dL — ABNORMAL HIGH (ref 70–99)
Glucose-Capillary: 328 mg/dL — ABNORMAL HIGH (ref 70–99)
Glucose-Capillary: 375 mg/dL — ABNORMAL HIGH (ref 70–99)

## 2019-07-25 LAB — COMPREHENSIVE METABOLIC PANEL
ALT: 39 U/L (ref 0–44)
AST: 45 U/L — ABNORMAL HIGH (ref 15–41)
Albumin: 3 g/dL — ABNORMAL LOW (ref 3.5–5.0)
Alkaline Phosphatase: 69 U/L (ref 38–126)
Anion gap: 13 (ref 5–15)
BUN: 33 mg/dL — ABNORMAL HIGH (ref 6–20)
CO2: 19 mmol/L — ABNORMAL LOW (ref 22–32)
Calcium: 8 mg/dL — ABNORMAL LOW (ref 8.9–10.3)
Chloride: 105 mmol/L (ref 98–111)
Creatinine, Ser: 1.66 mg/dL — ABNORMAL HIGH (ref 0.61–1.24)
GFR calc Af Amer: 52 mL/min — ABNORMAL LOW (ref >60)
GFR calc non Af Amer: 45 mL/min — ABNORMAL LOW (ref >60)
Glucose, Bld: 300 mg/dL — ABNORMAL HIGH (ref 70–99)
Potassium: 3.7 mmol/L (ref 3.5–5.1)
Sodium: 137 mmol/L (ref 135–145)
Total Bilirubin: 0.5 mg/dL (ref 0.3–1.2)
Total Protein: 6.7 g/dL (ref 6.5–8.1)

## 2019-07-25 LAB — C-REACTIVE PROTEIN: CRP: 3.6 mg/dL — ABNORMAL HIGH (ref <1.0)

## 2019-07-25 LAB — MAGNESIUM: Magnesium: 2.1 mg/dL (ref 1.7–2.4)

## 2019-07-25 LAB — SARS CORONAVIRUS 2 BY RT PCR (HOSPITAL ORDER, PERFORMED IN ~~LOC~~ HOSPITAL LAB): SARS Coronavirus 2: POSITIVE — AB

## 2019-07-25 LAB — PHOSPHORUS: Phosphorus: 2.7 mg/dL (ref 2.5–4.6)

## 2019-07-25 LAB — FERRITIN: Ferritin: 518 ng/mL — ABNORMAL HIGH (ref 24–336)

## 2019-07-25 LAB — D-DIMER, QUANTITATIVE: D-Dimer, Quant: 0.6 ug/mL-FEU — ABNORMAL HIGH (ref 0.00–0.50)

## 2019-07-25 MED ORDER — SODIUM CHLORIDE 0.9 % IV SOLN
200.0000 mg | Freq: Once | INTRAVENOUS | Status: AC
  Administered 2019-07-25: 200 mg via INTRAVENOUS
  Filled 2019-07-25: qty 40

## 2019-07-25 MED ORDER — INSULIN GLARGINE 100 UNIT/ML ~~LOC~~ SOLN
10.0000 [IU] | Freq: Every day | SUBCUTANEOUS | Status: DC
  Administered 2019-07-25 – 2019-07-27 (×3): 10 [IU] via SUBCUTANEOUS
  Filled 2019-07-25 (×3): qty 0.1

## 2019-07-25 MED ORDER — SODIUM CHLORIDE 0.9 % IV SOLN
100.0000 mg | INTRAVENOUS | Status: DC
  Administered 2019-07-26 – 2019-07-28 (×3): 100 mg via INTRAVENOUS
  Filled 2019-07-25 (×4): qty 20

## 2019-07-25 MED ORDER — INSULIN ASPART 100 UNIT/ML ~~LOC~~ SOLN
6.0000 [IU] | Freq: Three times a day (TID) | SUBCUTANEOUS | Status: DC
  Administered 2019-07-26 (×2): 6 [IU] via SUBCUTANEOUS

## 2019-07-25 NOTE — Progress Notes (Signed)
PROGRESS NOTE  Ronald Woodward [id number] DOB: 1961-03-20 DOA: 07/23/2019 PCP: System, Provider Not In  HPI/Recap of past 24 hours: HPI from Dr Doristine Bosworth Lucianne Muss is a 58 y.o. male with medical history significant of morbid obesity with BMI of 40, hypertension, diabetes mellitus, obstructive sleep apnea on CPAP, hyperlipidemia, unprovoked DVT-on Xarelto for many years, presents to emergency department due to worsening of shortness of breath, subjective fever, chills, cough since 4 to 5 days.  Reports dry cough, decreased appetite, generalized body ache, loose stool, nausea, weakness.  [facility], however has not had results back yet. Pt is compliant with his Xarelto. In the ED, patient was tachycardic, hypoxic with oxygen saturation of 88%-placed on 2 L of oxygen via nasal cannula.  CBC: No leukocytosis.  CMP shows potassium of 3.0, AKI, lactiCOVID-19 negative, chest x-ray suggestive of atypical pneumonia including viral infection such as COVID-19.    Today, patient still complaining of significant cough, denies any significant worsening shortness of breath, no fever/chills noted.    Assessment/Plan: Principal Problem:   Acute respiratory failure with hypoxia (HCC) Active Problems:   DVT (deep venous thrombosis) (HCC)   Morbid obesity (HCC)   Essential hypertension   Diabetes (HCC)   OSA on CPAP   AKI (acute kidney injury) ([place])   Hypokalemia  Acute hypoxic respiratory failure 2/2 COVID-19 pneumonia Currently afebrile, with leukocytosis (on steroids) Currently saturating above 90% on room air COVID-19 initially negative, repeat rapid test positive Inflammatory markers elevated D-dimer 0.68-0.81 BNP 21 Procalcitonin negative BC x2 pending Chest x-ray showed pulmonary interstitial prominence bilaterally Continue Solu-Medrol, started on remdesivir, cough suppressant as needed Trend inflammatory markers Supplemental oxygen as needed   AKI Improving Likely due to above Last creatinine on care everywhere was 0.8 on 03/29/2019 Creatinine 3.03 on presentation, now down to 1.66 Renal ultrasound unremarkable Continue gentle IV fluids for now Daily CMP  Transaminitis Elevated AST, ALT, currently improving Likely due to viral infection Trend LFTs  Hypertension BP stable Continue metoprolol, continue to hold ACE inhibitor due to AKI  Diabetes mellitus type 2 with hyperglycemia A1c was 7.8 on 07/23/2019 SSI, Lantus, NovoLog 3 times daily, Accu-Cheks, hypoglycemic protocol Hold Metformin  OSA On CPAP at home If Covid positive, would need oxygen while sleeping  History of unprovoked DVT Continue Xarelto for now  Hyperlipidemia Continue statin  Morbid obesity BMI 40.82 Lifestyle modification advised         Malnutrition Type:      Malnutrition Characteristics:      Nutrition Interventions:       Estimated body mass index is 40.82 kg/m as calculated from the following:   Height as of this encounter: 6' (1.829 m).   Weight as of this encounter: 136.5 kg.     Code Status: Full  Family Communication: None at bedside  Disposition Plan: To be determined   Consultants:  None  Procedures:  None  Antimicrobials:  None  DVT prophylaxis: Xarelto   Objective: Vitals:   07/24/19 1349 07/24/19 1610 07/24/19 2244 07/25/19 0739  BP:  129/78 (!) 148/79 140/75  Pulse:  72  80  Resp: 18 18 16 16   Temp:  98.2 F (36.8 C) 98.4 F (36.9 C) 98.2 F (36.8 C)  TempSrc:  Axillary Oral Oral  SpO2:  91% 92% 90%  Weight:      Height:        Intake/Output Summary (Last 24 hours) at 07/25/2019 1654 Last  data filed at 07/25/2019 1045 Gross per 24 hour  Intake 1740 ml  Output 2250 ml  Net -510 ml   Filed Weights   07/23/19 1459 07/23/19 2343  Weight: (!) 136.5 kg (!) 136.5 kg    Exam:  General: NAD   Cardiovascular: S1, S2 present  Respiratory:  Diminished breath sounds  bilaterally at the bases  Abdomen: Soft, nontender, nondistended, bowel sounds present  Musculoskeletal: No bilateral pedal edema noted  Skin: Normal  Psychiatry: Normal mood   Data Reviewed: CBC: Recent Labs  Lab 07/23/19 1448 07/24/19 0506 07/25/19 0429  WBC 8.8 5.2 15.2*  NEUTROABS 7.3 4.5 14.1*  HGB 14.4 13.6 12.8*  HCT 43.0 41.1 38.3*  MCV 84.8 85.6 85.7  PLT 259 288 [id number]   Basic Metabolic Panel: Recent Labs  Lab 07/23/19 1448 07/24/19 0506 07/25/19 0429  NA 135 136 137  K 3.0* 3.6 3.7  CL 99 102 105  CO2 20* 21* 19*  GLUCOSE 193* 332* 300*  BUN 31* 36* 33*  CREATININE 3.03* 2.49* 1.66*  CALCIUM 8.0* 7.9* 8.0*  MG  --  2.3 2.1  PHOS  --  2.1* 2.7   GFR: Estimated Creatinine Clearance: 70.3 mL/min (A) (by C-G formula based on SCr of 1.66 mg/dL (H)). Liver Function Tests: Recent Labs  Lab 07/23/19 1448 07/24/19 0506 07/25/19 0429  AST 60* 49* 45*  ALT 53* 44 39  ALKPHOS 76 72 69  BILITOT 0.6 0.6 0.5  PROT 8.0 7.3 6.7  ALBUMIN 3.7 3.2* 3.0*   No results for input(s): LIPASE, AMYLASE in the last 168 hours. No results for input(s): AMMONIA in the last 168 hours. Coagulation Profile: No results for input(s): INR, PROTIME in the last 168 hours. Cardiac Enzymes: No results for input(s): CKTOTAL, CKMB, CKMBINDEX, TROPONINI in the last 168 hours. BNP (last 3 results) No results for input(s): PROBNP in the last 8760 hours. HbA1C: Recent Labs    07/23/19 1753  HGBA1C 7.8*   CBG: Recent Labs  Lab 07/24/19 1606 07/24/19 2124 07/25/19 0732 07/25/19 1146 07/25/19 1638  GLUCAP 271* 324* 331* 322* 328*   Lipid Profile: Recent Labs    07/23/19 1753  CHOL 101  HDL 21*  LDLCALC 48  TRIG 159*  CHOLHDL 4.8   Thyroid Function Tests: No results for input(s): TSH, T4TOTAL, FREET4, T3FREE, THYROIDAB in the last 72 hours. Anemia Panel: Recent Labs    07/24/19 0506 07/25/19 0429  FERRITIN 659* 518*   Urine analysis: No results found for:  COLORURINE, APPEARANCEUR, LABSPEC, PHURINE, GLUCOSEU, HGBUR, BILIRUBINUR, KETONESUR, PROTEINUR, UROBILINOGEN, NITRITE, LEUKOCYTESUR Sepsis Labs: @LABRCNTIP (procalcitonin:4,lacticidven:4)  ) Recent Results (from the past 240 hour(s))  Culture, blood (Routine X 2) w Reflex to ID Panel     Status: None (Preliminary result)   Collection Time: 07/23/19  5:32 PM   Specimen: BLOOD  Result Value Ref Range Status   Specimen Description BLOOD RIGHT ANTECUBITAL  Final   Special Requests   Final    BOTTLES DRAWN AEROBIC AND ANAEROBIC Blood Culture adequate volume   Culture   Final    NO GROWTH 2 DAYS Performed at [facility], [address] [place], [place] [address]    Report Status PENDING  Incomplete  Culture, blood (Routine X 2) w Reflex to ID Panel     Status: None (Preliminary result)   Collection Time: 07/23/19  5:37 PM   Specimen: BLOOD  Result Value Ref Range Status   Specimen Description BLOOD LEFT ANTECUBITAL  Final  Special Requests   Final    BOTTLES DRAWN AEROBIC ONLY Blood Culture results may not be optimal due to an inadequate volume of blood received in culture bottles   Culture   Final    NO GROWTH 2 DAYS Performed at [facility], [place] [address], [place], [place] [address]    Report Status PENDING  Incomplete  Novel Coronavirus, NAA (Hosp order, Send-out to Ref Lab; TAT 18-24 hrs     Status: None   Collection Time: 07/23/19  7:28 PM   Specimen: Nasopharyngeal Swab; Respiratory  Result Value Ref Range Status   SARS-CoV-2, NAA NOT DETECTED NOT DETECTED Final    Comment: (NOTE) This nucleic acid amplification test was developed and its performance characteristics determined by [employer]. Nucleic acid amplification tests include PCR and TMA. This test has not been FDA cleared or approved. This test has been authorized by FDA under an Emergency Use Authorization (EUA). This test is only authorized for the duration of time the declaration that  circumstances exist justifying the authorization of the emergency use of in vitro diagnostic tests for detection of SARS-CoV-2 virus and/or diagnosis of COVID-19 infection under section 564(b)(1) of the Act, 21 U.S.C. [id number]) (1), unless the authorization is terminated or revoked sooner. When diagnostic testing is negative, the possibility of a false negative result should be considered in the context of a patient's recent exposures and the presence of clinical signs and symptoms consistent with COVID-19. An individual without symptoms of COVID- 19 and who is not shedding SARS-CoV-2 vi rus would expect to have a negative (not detected) result in this assay. Performed At: [facility] [address] [place], [place] [id number] Rush Farmer MD [id number]    [place]  Final    Comment: Performed at [facility], [place] [address] [place], [place] [address]  SARS Coronavirus 2 by RT PCR (hospital order, performed in [facility] hospital lab) Nasopharyngeal Nasopharyngeal Swab     Status: Abnormal   Collection Time: 07/25/19  8:00 AM   Specimen: Nasopharyngeal Swab  Result Value Ref Range Status   SARS Coronavirus 2 POSITIVE (A) NEGATIVE Final    Comment: RESULT CALLED TO, READ BACK BY AND VERIFIED WITH: K PACE,RN AT 0924 07/25/2019 BY L BENFIELD (NOTE) If result is NEGATIVE SARS-CoV-2 target nucleic acids are NOT DETECTED. The SARS-CoV-2 RNA is generally detectable in upper and lower  respiratory specimens during the acute phase of infection. The lowest  concentration of SARS-CoV-2 viral copies this assay can detect is 250  copies / mL. A negative result does not preclude SARS-CoV-2 infection  and should not be used as the sole basis for treatment or other  patient management decisions.  A negative result may occur with  improper specimen collection / handling, submission of specimen other  than nasopharyngeal swab, presence of viral  mutation(s) within the  areas targeted by this assay, and inadequate number of viral copies  (<250 copies / mL). A negative result must be combined with clinical  observations, patient history, and epidemiological information. If result is POSITIVE SARS-CoV-2 target nucleic acids are DETECT ED. The SARS-CoV-2 RNA is generally detectable in upper and lower  respiratory specimens during the acute phase of infection.  Positive  results are indicative of active infection with SARS-CoV-2.  Clinical  correlation with patient history and other diagnostic information is  necessary to determine patient infection status.  Positive results do  not rule out bacterial infection or co-infection with  other viruses. If result is PRESUMPTIVE POSTIVE SARS-CoV-2 nucleic acids MAY BE PRESENT.   A presumptive positive result was obtained on the submitted specimen  and confirmed on repeat testing.  While 2019 novel coronavirus  (SARS-CoV-2) nucleic acids may be present in the submitted sample  additional confirmatory testing may be necessary for epidemiological  and / or clinical management purposes  to differentiate between  SARS-CoV-2 and other Sarbecovirus currently known to infect humans.  If clinically indicated additional testing with an alternate test  methodology ([id number] [id number]) is advised. The SARS-CoV-2 RNA is generally  detectable in upper and lower respiratory specimens during the acute  phase of infection. The expected result is Negative. Fact Sheet for Patients:  StrictlyIdeas.no Fact Sheet for Healthcare Providers: BankingDealers.co.za This test is not yet approved or cleared by the [place] FDA and has been authorized for detection and/or diagnosis of SARS-CoV-2 by FDA under an Emergency Use Authorization (EUA).  This EUA will remain in effect (meaning this test can be used) for the duration of the COVID-19 declaration under Section 564(b)(1)  of the Act, 21 U.S.C. section 360bbb-3(b)(1), unless the authorization is terminated or revoked sooner. Performed at [facility], [place] [address] [place], [place] [address]       Studies: No results found.  Scheduled Meds: . atorvastatin  20 mg Oral Daily  . insulin aspart  0-15 Units Subcutaneous TID WC  . insulin aspart  0-5 Units Subcutaneous QHS  . [START ON 07/26/2019] insulin aspart  6 Units Subcutaneous TID WC  . insulin glargine  10 Units Subcutaneous Daily  . methylPREDNISolone (SOLU-MEDROL) injection  0.5 mg/kg Intravenous Q12H  . metoprolol succinate  50 mg Oral Daily  . rivaroxaban  10 mg Oral Q supper  . sodium chloride flush  3 mL Intravenous Once  . vitamin C  500 mg Oral Daily  . zinc sulfate  220 mg Oral Daily    Continuous Infusions: . sodium chloride 75 mL/hr at 07/25/19 1640  . [START ON 07/26/2019] remdesivir 100 mg in NS 250 mL       LOS: 2 days     Alma Friendly, MD [facility]  If 7PM-7AM, please contact night-coverage [email or web address] 07/25/2019, 4:54 PM

## 2019-07-25 NOTE — Progress Notes (Signed)
Inpatient Diabetes Program Recommendations  AACE/ADA: New Consensus Statement on Inpatient Glycemic Control   Target Ranges:  Prepandial:   less than 140 mg/dL      Peak postprandial:   less than 180 mg/dL (1-2 hours)      Critically ill patients:  140 - 180 mg/dL   Results for Ronald Woodward, Ronald Woodward (MRN [id number]) as of 07/25/2019 14:39  Ref. Range 07/24/2019 07:28 07/24/2019 11:28 07/24/2019 16:06 07/24/2019 21:24 07/25/2019 07:32 07/25/2019 11:46  Glucose-Capillary Latest Ref Range: 70 - 99 mg/dL 278 (H)  Novolog 8 units 376 (H)  Novolog 15 units 271 (H)  Novolog 8 units 324 (H) 331 (H)  Novolog 11 units  Lantus 10 units 322 (H)  Novolog 11 units  Results for Ronald Woodward, Ronald Woodward (MRN [id number]) as of 07/25/2019 14:39  Ref. Range 07/23/2019 17:53  Hemoglobin A1C Latest Ref Range: 4.8 - 5.6 % 7.8 (H)   Review of Glycemic Control  Diabetes history: DM2 Outpatient Diabetes medications: Metformin 1000 mg daily Current orders for Inpatient glycemic control: Lantus 10 units daily, Novolog 0-15 units TID with meals, Novolog 0-5 units QHS; Solumedrol 68.125 mg Q12H  Inpatient Diabetes Program Recommendations:   Insulin-meal coverage: If steroids are continued, please consider ordering Novolog 6 units TID with meals for meal coverage if patient eats at least 50% of meals.  Insulin-Correction: Please consider ordering Novolog 0-5 units QHS for bedtime correction.  Insulin-Basal: Noted Lantus 10 units daily ordered today.  Thanks, Barnie Alderman, RN, MSN, CDE Diabetes Coordinator Inpatient Diabetes Program [phone] (Team Pager from 8am to 5pm)

## 2019-07-25 NOTE — Progress Notes (Signed)
Pharmacy: Remdesivir  58 YOM admitted 10/31 with SOB and found to have COVID PNA. Noted to have CXR+ and now requiring O2 - pharmacy consulted to start Remdesivir.   AST mildly elevated, ALT wnl.   Plan - Start Remdesivir 200 mg IV x 1 followed by 100 mg IV daily x 4 days - Pharmacy will sign off and monitor peripherally  Thank you for allowing pharmacy to be a part of this patient's care.  Alycia Rossetti, PharmD, BCPS Clinical Pharmacist Clinical phone for 07/25/2019: [phone] 07/25/2019 11:37 AM   **Pharmacist phone directory can now be found on [email or web address] ([email or web address]).  Listed under [place].

## 2019-07-25 NOTE — Progress Notes (Signed)
Assumed care of pt, no changes to shift assessment by kATIE p

## 2019-07-25 NOTE — Progress Notes (Signed)
MD notified patient is Covid +. Txf to 2W29.

## 2019-07-26 DIAGNOSIS — E876 Hypokalemia: Secondary | ICD-10-CM | POA: Diagnosis not present

## 2019-07-26 DIAGNOSIS — Z6841 Body Mass Index (BMI) 40.0 and over, adult: Secondary | ICD-10-CM | POA: Diagnosis not present

## 2019-07-26 DIAGNOSIS — R7401 Elevation of levels of liver transaminase levels: Secondary | ICD-10-CM | POA: Diagnosis not present

## 2019-07-26 DIAGNOSIS — E1165 Type 2 diabetes mellitus with hyperglycemia: Secondary | ICD-10-CM | POA: Diagnosis not present

## 2019-07-26 DIAGNOSIS — U071 COVID-19: Secondary | ICD-10-CM | POA: Diagnosis not present

## 2019-07-26 DIAGNOSIS — N179 Acute kidney failure, unspecified: Secondary | ICD-10-CM | POA: Diagnosis not present

## 2019-07-26 DIAGNOSIS — J1289 Other viral pneumonia: Secondary | ICD-10-CM | POA: Diagnosis not present

## 2019-07-26 DIAGNOSIS — I10 Essential (primary) hypertension: Secondary | ICD-10-CM | POA: Diagnosis not present

## 2019-07-26 DIAGNOSIS — J9601 Acute respiratory failure with hypoxia: Secondary | ICD-10-CM | POA: Diagnosis not present

## 2019-07-26 LAB — COMPREHENSIVE METABOLIC PANEL
ALT: 38 U/L (ref 0–44)
AST: 41 U/L (ref 15–41)
Albumin: 2.9 g/dL — ABNORMAL LOW (ref 3.5–5.0)
Alkaline Phosphatase: 74 U/L (ref 38–126)
Anion gap: 15 (ref 5–15)
BUN: 27 mg/dL — ABNORMAL HIGH (ref 6–20)
CO2: 20 mmol/L — ABNORMAL LOW (ref 22–32)
Calcium: 8.2 mg/dL — ABNORMAL LOW (ref 8.9–10.3)
Chloride: 105 mmol/L (ref 98–111)
Creatinine, Ser: 1.26 mg/dL — ABNORMAL HIGH (ref 0.61–1.24)
GFR calc Af Amer: >60 mL/min (ref >60)
GFR calc non Af Amer: >60 mL/min (ref >60)
Glucose, Bld: 267 mg/dL — ABNORMAL HIGH (ref 70–99)
Potassium: 3.8 mmol/L (ref 3.5–5.1)
Sodium: 140 mmol/L (ref 135–145)
Total Bilirubin: 0.7 mg/dL (ref 0.3–1.2)
Total Protein: 6.9 g/dL (ref 6.5–8.1)

## 2019-07-26 LAB — C-REACTIVE PROTEIN: CRP: 1.8 mg/dL — ABNORMAL HIGH (ref <1.0)

## 2019-07-26 LAB — CBC WITH DIFFERENTIAL/PLATELET
Abs Immature Granulocytes: 0.18 10*3/uL — ABNORMAL HIGH (ref 0.00–0.07)
Basophils Absolute: 0 10*3/uL (ref 0.0–0.1)
Basophils Relative: 0 %
Eosinophils Absolute: 0 10*3/uL (ref 0.0–0.5)
Eosinophils Relative: 0 %
HCT: 39.3 % (ref 39.0–52.0)
Hemoglobin: 12.9 g/dL — ABNORMAL LOW (ref 13.0–17.0)
Immature Granulocytes: 1 %
Lymphocytes Relative: 8 %
Lymphs Abs: 1.2 10*3/uL (ref 0.7–4.0)
MCH: 28.5 pg (ref 26.0–34.0)
MCHC: 32.8 g/dL (ref 30.0–36.0)
MCV: 86.9 fL (ref 80.0–100.0)
Monocytes Absolute: 0.7 10*3/uL (ref 0.1–1.0)
Monocytes Relative: 5 %
Neutro Abs: 12.3 10*3/uL — ABNORMAL HIGH (ref 1.7–7.7)
Neutrophils Relative %: 86 %
Platelets: 362 10*3/uL (ref 150–400)
RBC: 4.52 MIL/uL (ref 4.22–5.81)
RDW: 14.4 % (ref 11.5–15.5)
WBC: 14.3 10*3/uL — ABNORMAL HIGH (ref 4.0–10.5)
nRBC: 0 % (ref 0.0–0.2)

## 2019-07-26 LAB — D-DIMER, QUANTITATIVE: D-Dimer, Quant: 0.68 ug/mL-FEU — ABNORMAL HIGH (ref 0.00–0.50)

## 2019-07-26 LAB — GLUCOSE, CAPILLARY
Glucose-Capillary: 262 mg/dL — ABNORMAL HIGH (ref 70–99)
Glucose-Capillary: 311 mg/dL — ABNORMAL HIGH (ref 70–99)
Glucose-Capillary: 316 mg/dL — ABNORMAL HIGH (ref 70–99)

## 2019-07-26 LAB — MAGNESIUM: Magnesium: 2 mg/dL (ref 1.7–2.4)

## 2019-07-26 LAB — FERRITIN: Ferritin: 361 ng/mL — ABNORMAL HIGH (ref 24–336)

## 2019-07-26 MED ORDER — ENSURE ENLIVE PO LIQD
237.0000 mL | Freq: Two times a day (BID) | ORAL | Status: DC
  Administered 2019-07-27 – 2019-07-29 (×3): 237 mL via ORAL

## 2019-07-26 MED ORDER — INSULIN ASPART 100 UNIT/ML ~~LOC~~ SOLN
8.0000 [IU] | Freq: Three times a day (TID) | SUBCUTANEOUS | Status: DC
  Administered 2019-07-26 – 2019-07-29 (×9): 8 [IU] via SUBCUTANEOUS

## 2019-07-26 NOTE — Progress Notes (Signed)
Report given to [place] at [facility].

## 2019-07-26 NOTE — Progress Notes (Signed)
PROGRESS NOTE  Ronald Woodward [id number] DOB: 07/31/1961 DOA: 07/23/2019 PCP: System, Provider Not In  HPI/Recap of past 24 hours: HPI from Dr Doristine Bosworth Lucianne Muss is a 58 y.o. male with medical history significant of morbid obesity with BMI of 40, hypertension, diabetes mellitus, obstructive sleep apnea on CPAP, hyperlipidemia, unprovoked DVT-on Xarelto for many years, presents to emergency department due to worsening of shortness of breath, subjective fever, chills, cough since 4 to 5 days.  Reports dry cough, decreased appetite, generalized body ache, loose stool, nausea, weakness.  Was tested for [facility] 2 days [facility], however has not had results back yet. Pt is compliant with his Xarelto. In the ED, patient was tachycardic, hypoxic with oxygen saturation of 88%-placed on 2 L of oxygen via nasal cannula.  CBC: No leukocytosis.  CMP shows potassium of 3.0, AKI, lactiCOVID-19 negative, chest x-ray suggestive of atypical pneumonia including viral infection such as COVID-19.   Today, patient reports feeling better, still with significant coughing spells.  Denies any worsening shortness of breath, fever/chills, nausea/vomiting, abdominal pain, diarrhea.    Assessment/Plan: Principal Problem:   Acute respiratory failure with hypoxia (HCC) Active Problems:   DVT (deep venous thrombosis) (HCC)   Morbid obesity (HCC)   Essential hypertension   Diabetes (HCC)   OSA on CPAP   AKI (acute kidney injury) ([place])   Hypokalemia  Acute hypoxic respiratory failure 2/2 COVID-19 pneumonia Currently afebrile, with leukocytosis (on steroids) Currently saturating above 90% on room air COVID-19 initially negative, repeat rapid test positive Inflammatory markers downtrending D-dimer 0.68-0.81 BNP 21 Procalcitonin negative BC x2 NGTD Chest x-ray showed pulmonary interstitial prominence bilaterally Continue Solu-Medrol, remdesivir, cough suppressant as needed Trend inflammatory  markers Supplemental oxygen as needed  AKI Improving Likely due to above Last creatinine on care everywhere was 0.8 on 03/29/2019 Creatinine 3.03 on presentation, now down to 1.26 Renal ultrasound unremarkable Discontinue IV fluids on 07/26/2019 Daily CMP  Transaminitis Resolved Elevated AST, ALT Likely due to viral infection Trend LFTs  Hypertension BP stable Continue metoprolol, continue to hold ACE inhibitor due to AKI  Diabetes mellitus type 2 with hyperglycemia A1c was 7.8 on 07/23/2019 SSI, Lantus, NovoLog 3 times daily, Accu-Cheks, hypoglycemic protocol Hold Metformin  OSA On CPAP at home Covid positive, would need oxygen while sleeping  History of unprovoked DVT Continue Xarelto  Hyperlipidemia Continue statin  Morbid obesity BMI 40.82 Lifestyle modification advised         Malnutrition Type:  Nutrition Problem: Increased nutrient needs Etiology: acute illness   Malnutrition Characteristics:  Signs/Symptoms: estimated needs   Nutrition Interventions:  Interventions: Ensure Enlive (each supplement provides 350kcal and 20 grams of protein)    Estimated body mass index is 40.82 kg/m as calculated from the following:   Height as of this encounter: 6' (1.829 m).   Weight as of this encounter: 136.5 kg.     Code Status: Full  Family Communication: Discussed with wife Gwyn who is RN on [place] here at [facility]  Disposition Plan: Transfer to [employer]:  None  Procedures:  None  Antimicrobials:  None  DVT prophylaxis: Xarelto   Objective: Vitals:   07/24/19 2244 07/25/19 0739 07/25/19 2315 07/26/19 0840  BP: (!) 148/79 140/75 137/69 132/73  Pulse:  80 71 65  Resp: 16 16 20 18   Temp: 98.4 F (36.9 C) 98.2 F (36.8 C) 98.7 F (37.1 C) 98.5 F (36.9 C)  TempSrc: Oral Oral Oral Oral  SpO2: 92%  90% 94% 90%  Weight:      Height:        Intake/Output Summary (Last 24 hours) at 07/26/2019 1216 Last data filed at  07/26/2019 0700 Gross per 24 hour  Intake 1812.5 ml  Output 1475 ml  Net 337.5 ml   Filed Weights   07/23/19 1459 07/23/19 2343  Weight: (!) 136.5 kg (!) 136.5 kg    Exam:  General: NAD, pleasant  Cardiovascular: S1, S2 present  Respiratory:  Diminished breath sounds bilaterally at the bases  Abdomen: Soft, nontender, nondistended, bowel sounds present  Musculoskeletal: No bilateral pedal edema noted  Skin: Normal  Psychiatry: Normal mood   Data Reviewed: CBC: Recent Labs  Lab 07/23/19 1448 07/24/19 0506 07/25/19 0429 07/26/19 0406  WBC 8.8 5.2 15.2* 14.3*  NEUTROABS 7.3 4.5 14.1* 12.3*  HGB 14.4 13.6 12.8* 12.9*  HCT 43.0 41.1 38.3* 39.3  MCV 84.8 85.6 85.7 86.9  PLT 259 288 325 [id number]   Basic Metabolic Panel: Recent Labs  Lab 07/23/19 1448 07/24/19 0506 07/25/19 0429 07/26/19 0406  NA 135 136 137 140  K 3.0* 3.6 3.7 3.8  CL 99 102 105 105  CO2 20* 21* 19* 20*  GLUCOSE 193* 332* 300* 267*  BUN 31* 36* 33* 27*  CREATININE 3.03* 2.49* 1.66* 1.26*  CALCIUM 8.0* 7.9* 8.0* 8.2*  MG  --  2.3 2.1 2.0  PHOS  --  2.1* 2.7  --    GFR: Estimated Creatinine Clearance: 92.6 mL/min (A) (by C-G formula based on SCr of 1.26 mg/dL (H)). Liver Function Tests: Recent Labs  Lab 07/23/19 1448 07/24/19 0506 07/25/19 0429 07/26/19 0406  AST 60* 49* 45* 41  ALT 53* 44 39 38  ALKPHOS 76 72 69 74  BILITOT 0.6 0.6 0.5 0.7  PROT 8.0 7.3 6.7 6.9  ALBUMIN 3.7 3.2* 3.0* 2.9*   No results for input(s): LIPASE, AMYLASE in the last 168 hours. No results for input(s): AMMONIA in the last 168 hours. Coagulation Profile: No results for input(s): INR, PROTIME in the last 168 hours. Cardiac Enzymes: No results for input(s): CKTOTAL, CKMB, CKMBINDEX, TROPONINI in the last 168 hours. BNP (last 3 results) No results for input(s): PROBNP in the last 8760 hours. HbA1C: Recent Labs    07/23/19 1753  HGBA1C 7.8*   CBG: Recent Labs  Lab 07/25/19 1146 07/25/19 1638  07/25/19 2130 07/26/19 0836 07/26/19 1133  GLUCAP 322* 328* 375* 262* 311*   Lipid Profile: Recent Labs    07/23/19 1753  CHOL 101  HDL 21*  LDLCALC 48  TRIG 159*  CHOLHDL 4.8   Thyroid Function Tests: No results for input(s): TSH, T4TOTAL, FREET4, T3FREE, THYROIDAB in the last 72 hours. Anemia Panel: Recent Labs    07/25/19 0429 07/26/19 0406  FERRITIN 518* 361*   Urine analysis: No results found for: COLORURINE, APPEARANCEUR, LABSPEC, PHURINE, GLUCOSEU, HGBUR, BILIRUBINUR, KETONESUR, PROTEINUR, UROBILINOGEN, NITRITE, LEUKOCYTESUR Sepsis Labs: @LABRCNTIP (procalcitonin:4,lacticidven:4)  ) Recent Results (from the past 240 hour(s))  Culture, blood (Routine X 2) w Reflex to ID Panel     Status: None (Preliminary result)   Collection Time: 07/23/19  5:32 PM   Specimen: BLOOD  Result Value Ref Range Status   Specimen Description BLOOD RIGHT ANTECUBITAL  Final   Special Requests   Final    BOTTLES DRAWN AEROBIC AND ANAEROBIC Blood Culture adequate volume   Culture   Final    NO GROWTH 3 DAYS Performed at [facility], [address],  [place] [address]    Report Status PENDING  Incomplete  Culture, blood (Routine X 2) w Reflex to ID Panel     Status: None (Preliminary result)   Collection Time: 07/23/19  5:37 PM   Specimen: BLOOD  Result Value Ref Range Status   Specimen Description BLOOD LEFT ANTECUBITAL  Final   Special Requests   Final    BOTTLES DRAWN AEROBIC ONLY Blood Culture results may not be optimal due to an inadequate volume of blood received in culture bottles   Culture   Final    NO GROWTH 3 DAYS Performed at [facility], [place] [address], [place], [place] [address]    Report Status PENDING  Incomplete  Novel Coronavirus, NAA (Hosp order, Send-out to Ref Lab; TAT 18-24 hrs     Status: None   Collection Time: 07/23/19  7:28 PM   Specimen: Nasopharyngeal Swab; Respiratory  Result Value Ref Range Status   SARS-CoV-2, NAA NOT DETECTED  NOT DETECTED Final    Comment: (NOTE) This nucleic acid amplification test was developed and its performance characteristics determined by [employer]. Nucleic acid amplification tests include PCR and TMA. This test has not been FDA cleared or approved. This test has been authorized by FDA under an Emergency Use Authorization (EUA). This test is only authorized for the duration of time the declaration that circumstances exist justifying the authorization of the emergency use of in vitro diagnostic tests for detection of SARS-CoV-2 virus and/or diagnosis of COVID-19 infection under section 564(b)(1) of the Act, 21 U.S.C. [id number]) (1), unless the authorization is terminated or revoked sooner. When diagnostic testing is negative, the possibility of a false negative result should be considered in the context of a patient's recent exposures and the presence of clinical signs and symptoms consistent with COVID-19. An individual without symptoms of COVID- 19 and who is not shedding SARS-CoV-2 vi rus would expect to have a negative (not detected) result in this assay. Performed At: [facility] [address] [place], [place] [id number] Rush Farmer MD [id number]    [place]  Final    Comment: Performed at [facility], [place] [address] [place], [place] [address]  SARS Coronavirus 2 by RT PCR (hospital order, performed in [facility] hospital lab) Nasopharyngeal Nasopharyngeal Swab     Status: Abnormal   Collection Time: 07/25/19  8:00 AM   Specimen: Nasopharyngeal Swab  Result Value Ref Range Status   SARS Coronavirus 2 POSITIVE (A) NEGATIVE Final    Comment: RESULT CALLED TO, READ BACK BY AND VERIFIED WITH: K PACE,RN AT 0924 07/25/2019 BY L BENFIELD (NOTE) If result is NEGATIVE SARS-CoV-2 target nucleic acids are NOT DETECTED. The SARS-CoV-2 RNA is generally detectable in upper and lower  respiratory specimens during the acute phase  of infection. The lowest  concentration of SARS-CoV-2 viral copies this assay can detect is 250  copies / mL. A negative result does not preclude SARS-CoV-2 infection  and should not be used as the sole basis for treatment or other  patient management decisions.  A negative result may occur with  improper specimen collection / handling, submission of specimen other  than nasopharyngeal swab, presence of viral mutation(s) within the  areas targeted by this assay, and inadequate number of viral copies  (<250 copies / mL). A negative result must be combined with clinical  observations, patient history, and epidemiological information. If result is POSITIVE SARS-CoV-2 target nucleic acids are DETECT ED. The SARS-CoV-2 RNA is  generally detectable in upper and lower  respiratory specimens during the acute phase of infection.  Positive  results are indicative of active infection with SARS-CoV-2.  Clinical  correlation with patient history and other diagnostic information is  necessary to determine patient infection status.  Positive results do  not rule out bacterial infection or co-infection with other viruses. If result is PRESUMPTIVE POSTIVE SARS-CoV-2 nucleic acids MAY BE PRESENT.   A presumptive positive result was obtained on the submitted specimen  and confirmed on repeat testing.  While 2019 novel coronavirus  (SARS-CoV-2) nucleic acids may be present in the submitted sample  additional confirmatory testing may be necessary for epidemiological  and / or clinical management purposes  to differentiate between  SARS-CoV-2 and other Sarbecovirus currently known to infect humans.  If clinically indicated additional testing with an alternate test  methodology ([id number] [id number]) is advised. The SARS-CoV-2 RNA is generally  detectable in upper and lower respiratory specimens during the acute  phase of infection. The expected result is Negative. Fact Sheet for Patients:   StrictlyIdeas.no Fact Sheet for Healthcare Providers: BankingDealers.co.za This test is not yet approved or cleared by the [place] FDA and has been authorized for detection and/or diagnosis of SARS-CoV-2 by FDA under an Emergency Use Authorization (EUA).  This EUA will remain in effect (meaning this test can be used) for the duration of the COVID-19 declaration under Section 564(b)(1) of the Act, 21 U.S.C. section 360bbb-3(b)(1), unless the authorization is terminated or revoked sooner. Performed at [facility], [place] [address], [place],  [address]       Studies: No results found.  Scheduled Meds: . atorvastatin  20 mg Oral Daily  . feeding supplement (ENSURE ENLIVE)  237 mL Oral BID BM  . insulin aspart  0-15 Units Subcutaneous TID WC  . insulin aspart  0-5 Units Subcutaneous QHS  . insulin aspart  8 Units Subcutaneous TID WC  . insulin glargine  10 Units Subcutaneous Daily  . methylPREDNISolone (SOLU-MEDROL) injection  0.5 mg/kg Intravenous Q12H  . metoprolol succinate  50 mg Oral Daily  . rivaroxaban  10 mg Oral Q supper  . sodium chloride flush  3 mL Intravenous Once  . vitamin C  500 mg Oral Daily  . zinc sulfate  220 mg Oral Daily    Continuous Infusions: . remdesivir 100 mg in NS 250 mL       LOS: 3 days     Alma Friendly, MD [facility]  If 7PM-7AM, please contact night-coverage [email or web address] 07/26/2019, 12:16 PM

## 2019-07-26 NOTE — Progress Notes (Signed)
Initial Nutrition Assessment  DOCUMENTATION CODES:   Morbid obesity  INTERVENTION:   -Ensure Enlive po BID, each supplement provides 350 kcal and 20 grams of protein  NUTRITION DIAGNOSIS:   Increased nutrient needs related to acute illness as evidenced by estimated needs.  GOAL:   Patient will meet greater than or equal to 90% of their needs  MONITOR:   PO intake, Labs, Weight trends, I & O's  REASON FOR ASSESSMENT:   Malnutrition Screening Tool   ASSESSMENT:   58 y.o. male with medical history significant of morbid obesity with BMI of 40, hypertension, diabetes mellitus, obstructive sleep apnea on CPAP, hyperlipidemia, unprovoked DVT-on Xarelto for many years, presents to emergency department due to worsening of shortness of breath, subjective fever, chills, cough since 4 to 5 days.  Reports dry cough, decreased appetite, generalized body ache, loose stool, nausea, weakness. 11/2: COVID-19 positive  **RD working remotely**  Per chart review, pt to transfer to [facility] d/t positive COVID-19 test. Patient began having symptoms 4-5 days PTA long with decreased appetite and nausea.  PO documented as 80-90% of meals on 11/1. Given increased needs from COVID infection, will order Ensure supplements. Once pt is at [facility], pt will receive additional supplements with meals.  Patient reports weight loss of 20 lbs. Per weight records, in care everywhere, pt weighed 310 lbs on 7/7. This is a 9 lb weight loss (2% wt loss x 4 months, insignificant for time frame).   I/Os: +47 ml since admit UOP: 1975 ml x 24 hrs  Medications: Vitamin C tablet, Zinc sulfate capsule, IV Zofran PRN Labs reviewed:  CBGs: [id number]  NUTRITION - FOCUSED PHYSICAL EXAM:  Unable to perform -working remotely.  Diet Order:   Diet Order            Diet 2 gram sodium Room service appropriate? Yes; Fluid consistency: Thin  Diet effective now              EDUCATION NEEDS:   No education needs have been  identified at this time  Skin:  Skin Assessment: Reviewed RN Assessment  Last BM:  11/3 -type 5  Height:   Ht Readings from Last 1 Encounters:  07/23/19 6' (1.829 m)    Weight:   Wt Readings from Last 1 Encounters:  07/23/19 (!) 136.5 kg    Ideal Body Weight:  80.9 kg  BMI:  Body mass index is 40.82 kg/m.  Estimated Nutritional Needs:   Kcal:  [id number]  Protein:  120-130g  Fluid:  2L/day   Clayton Bibles, MS, RD, LDN Inpatient Clinical Dietitian Pager: [phone] After Hours Pager: [phone]

## 2019-07-26 NOTE — Progress Notes (Deleted)
Pt refused Remdesivir. It was explained to patient that its used for treating covid infection.  An information sheet from the pharmacy was provided for him to read. I asked if he had questions about the medication and he said he did not want to take the medication. MD notified.

## 2019-07-26 NOTE — Progress Notes (Signed)
Pt arrived from [facility] x 4, no signs of respiratory distress, ambulatory, NSR, VSS, 93% RA, No skin issues, blood sugar taken, ate dinner. Will continue to monitor.

## 2019-07-27 LAB — COMPREHENSIVE METABOLIC PANEL
ALT: 35 U/L (ref 0–44)
ALT: 34 U/L (ref 0–44)
AST: 34 U/L (ref 15–41)
AST: 36 U/L (ref 15–41)
Albumin: 2.9 g/dL — ABNORMAL LOW (ref 3.5–5.0)
Albumin: 3.2 g/dL — ABNORMAL LOW (ref 3.5–5.0)
Alkaline Phosphatase: 72 U/L (ref 38–126)
Alkaline Phosphatase: 73 U/L (ref 38–126)
Anion gap: 11 (ref 5–15)
Anion gap: 12 (ref 5–15)
BUN: 24 mg/dL — ABNORMAL HIGH (ref 6–20)
BUN: 26 mg/dL — ABNORMAL HIGH (ref 6–20)
CO2: 23 mmol/L (ref 22–32)
CO2: 21 mmol/L — ABNORMAL LOW (ref 22–32)
Calcium: 8 mg/dL — ABNORMAL LOW (ref 8.9–10.3)
Calcium: 7.9 mg/dL — ABNORMAL LOW (ref 8.9–10.3)
Chloride: 105 mmol/L (ref 98–111)
Chloride: 103 mmol/L (ref 98–111)
Creatinine, Ser: 1.04 mg/dL (ref 0.61–1.24)
Creatinine, Ser: 1.05 mg/dL (ref 0.61–1.24)
GFR calc Af Amer: >60 mL/min (ref >60)
GFR calc Af Amer: >60 mL/min (ref >60)
GFR calc non Af Amer: >60 mL/min (ref >60)
GFR calc non Af Amer: >60 mL/min (ref >60)
Glucose, Bld: 307 mg/dL — ABNORMAL HIGH (ref 70–99)
Glucose, Bld: 353 mg/dL — ABNORMAL HIGH (ref 70–99)
Potassium: 3.8 mmol/L (ref 3.5–5.1)
Potassium: 3.8 mmol/L (ref 3.5–5.1)
Sodium: 139 mmol/L (ref 135–145)
Sodium: 136 mmol/L (ref 135–145)
Total Bilirubin: 0.4 mg/dL (ref 0.3–1.2)
Total Bilirubin: 0.8 mg/dL (ref 0.3–1.2)
Total Protein: 6.4 g/dL — ABNORMAL LOW (ref 6.5–8.1)
Total Protein: 6.6 g/dL (ref 6.5–8.1)

## 2019-07-27 LAB — CBC WITH DIFFERENTIAL/PLATELET
Abs Immature Granulocytes: 0.31 10*3/uL — ABNORMAL HIGH (ref 0.00–0.07)
Basophils Absolute: 0 10*3/uL (ref 0.0–0.1)
Basophils Relative: 0 %
Eosinophils Absolute: 0 10*3/uL (ref 0.0–0.5)
Eosinophils Relative: 0 %
HCT: 37.6 % — ABNORMAL LOW (ref 39.0–52.0)
Hemoglobin: 12.2 g/dL — ABNORMAL LOW (ref 13.0–17.0)
Immature Granulocytes: 3 %
Lymphocytes Relative: 7 %
Lymphs Abs: 0.7 10*3/uL (ref 0.7–4.0)
MCH: 28.5 pg (ref 26.0–34.0)
MCHC: 32.4 g/dL (ref 30.0–36.0)
MCV: 87.9 fL (ref 80.0–100.0)
Monocytes Absolute: 0.4 10*3/uL (ref 0.1–1.0)
Monocytes Relative: 4 %
Neutro Abs: 9.3 10*3/uL — ABNORMAL HIGH (ref 1.7–7.7)
Neutrophils Relative %: 86 %
Platelets: 347 10*3/uL (ref 150–400)
RBC: 4.28 MIL/uL (ref 4.22–5.81)
RDW: 14.2 % (ref 11.5–15.5)
WBC: 10.9 10*3/uL — ABNORMAL HIGH (ref 4.0–10.5)
nRBC: 0 % (ref 0.0–0.2)

## 2019-07-27 LAB — D-DIMER, QUANTITATIVE
D-Dimer, Quant: 0.82 ug/mL-FEU — ABNORMAL HIGH (ref 0.00–0.50)
D-Dimer, Quant: 1.03 ug/mL-FEU — ABNORMAL HIGH (ref 0.00–0.50)

## 2019-07-27 LAB — GLUCOSE, CAPILLARY
Glucose-Capillary: 279 mg/dL — ABNORMAL HIGH (ref 70–99)
Glucose-Capillary: 281 mg/dL — ABNORMAL HIGH (ref 70–99)
Glucose-Capillary: 351 mg/dL — ABNORMAL HIGH (ref 70–99)
Glucose-Capillary: 428 mg/dL — ABNORMAL HIGH (ref 70–99)
Glucose-Capillary: 364 mg/dL — ABNORMAL HIGH (ref 70–99)

## 2019-07-27 LAB — C-REACTIVE PROTEIN: CRP: 1.1 mg/dL — ABNORMAL HIGH (ref <1.0)

## 2019-07-27 LAB — FERRITIN
Ferritin: 324 ng/mL (ref 24–336)
Ferritin: 396 ng/mL — ABNORMAL HIGH (ref 24–336)

## 2019-07-27 LAB — MAGNESIUM: Magnesium: 1.8 mg/dL (ref 1.7–2.4)

## 2019-07-27 MED ORDER — INSULIN GLARGINE 100 UNIT/ML ~~LOC~~ SOLN: 15.0000 [IU] | Freq: Every day | SUBCUTANEOUS | Status: DC

## 2019-07-27 MED ORDER — INSULIN ASPART 100 UNIT/ML ~~LOC~~ SOLN
0.0000 [IU] | Freq: Three times a day (TID) | SUBCUTANEOUS | Status: DC
  Administered 2019-07-27: 20 [IU] via SUBCUTANEOUS
  Administered 2019-07-28: 18:00:00 7 [IU] via SUBCUTANEOUS
  Administered 2019-07-28: 11 [IU] via SUBCUTANEOUS
  Administered 2019-07-28 – 2019-07-29 (×2): 7 [IU] via SUBCUTANEOUS
  Administered 2019-07-29: 08:00:00 3 [IU] via SUBCUTANEOUS

## 2019-07-27 MED ORDER — INSULIN ASPART 100 UNIT/ML ~~LOC~~ SOLN
0.0000 [IU] | Freq: Every day | SUBCUTANEOUS | Status: DC
  Administered 2019-07-27: 5 [IU] via SUBCUTANEOUS
  Administered 2019-07-28: 3 [IU] via SUBCUTANEOUS

## 2019-07-27 MED ORDER — INSULIN GLARGINE 100 UNIT/ML ~~LOC~~ SOLN
5.0000 [IU] | Freq: Once | SUBCUTANEOUS | Status: DC
  Filled 2019-07-27: qty 0.05

## 2019-07-27 MED ORDER — INSULIN GLARGINE 100 UNIT/ML ~~LOC~~ SOLN
10.0000 [IU] | Freq: Two times a day (BID) | SUBCUTANEOUS | Status: DC
  Administered 2019-07-27 – 2019-07-28 (×2): 10 [IU] via SUBCUTANEOUS
  Filled 2019-07-27 (×3): qty 0.1

## 2019-07-27 MED ORDER — INSULIN GLARGINE 100 UNIT/ML ~~LOC~~ SOLN: 20.0000 [IU] | Freq: Every day | SUBCUTANEOUS | Status: DC

## 2019-07-27 MED ORDER — DEXAMETHASONE SODIUM PHOSPHATE 10 MG/ML IJ SOLN
6.0000 mg | Freq: Every day | INTRAMUSCULAR | Status: DC
  Administered 2019-07-28 – 2019-07-29 (×2): 6 mg via INTRAVENOUS
  Filled 2019-07-27 (×2): qty 1

## 2019-07-27 NOTE — Progress Notes (Signed)
  Inpatient Diabetes Program Recommendations  AACE/ADA: New Consensus Statement on Inpatient Glycemic Control (2015)  Target Ranges:  Prepandial:   less than 140 mg/dL      Peak postprandial:   less than 180 mg/dL (1-2 hours)      Critically ill patients:  140 - 180 mg/dL   Lab Results  Component Value Date   GLUCAP 281 (H) 07/27/2019   HGBA1C 7.8 (H) 07/23/2019    Review of Glycemic Control  Results for JULIYAN, WOOSLEY (MRN [id number]) as of 07/27/2019 10:24  Ref. Range 07/26/2019 11:33 07/26/2019 20:07 07/27/2019 08:07  Glucose-Capillary Latest Ref Range: 70 - 99 mg/dL 311 (H)  Novolog 6units 316 (H)  Novolog 3units 281 (H)  Novolog 16units    Diabetes history: DM2 Outpatient Diabetes medications: Metformin 1000 mg  Current orders for Inpatient glycemic control: Lantus 10 units everyday; Novolog 8 units TID meal coverage; Novolog 0-15 units with meals TID  and 0-5 QHS; Solumedrol 68.125 mg Q12H  Inpatient Diabetes Program Recommendations:     -noted 8 units of meal coverage added last evening -Please consider increasing Lantus 10-12 units BID as CBG's trending between 281-375  Thanks, Geoffry Paradise, RN, BSN Diabetes Coordinator [phone] (8a-5p)

## 2019-07-27 NOTE — Progress Notes (Addendum)
PROGRESS NOTE    Ronald Woodward  [id number] DOB: 04-17-1961 DOA: 07/23/2019 PCP: Ronald Cruel, MD   Brief Narrative:  HPI from Dr Ronald Woodward 58 y.o.malewith medical history significant ofmorbid obesity with BMI of 40, hypertension, diabetes mellitus, obstructive sleep apnea on CPAP, hyperlipidemia, unprovoked DVT-on Xarelto for many years, presents to emergency department due to worsening of shortness of breath, subjective fever, chills, cough since 4 to 5 days. Reports dry cough, decreased appetite, generalized body ache, loose stool, nausea, weakness. [facility], however has not had results back yet. Pt is compliant with his Xarelto. In the ED, patient was tachycardic, hypoxic with oxygen saturation of 88%-placed on 2 L of oxygen via nasal cannula. CBC: No leukocytosis. CMP shows potassium of 3.0, AKI, lactiCOVID-19 negative, chest x-ray suggestive of atypical pneumonia including viral infection such as COVID-19.  Assessment & Plan:   Principal Problem:   Acute respiratory failure with hypoxia (HCC) Active Problems:   DVT (deep venous thrombosis) (HCC)   Morbid obesity (HCC)   Essential hypertension   Diabetes (HCC)   OSA on CPAP   AKI (acute kidney injury) (Ronald Woodward)   Hypokalemia   Acute hypoxic respiratory failure 2/2 COVID-19 pneumonia Currently afebrile, with leukocytosis (on steroids) Currently saturating above 90% on room air COVID-19 initially negative, repeat rapid test positive Follow inflammatory markers daily ->  D dimer elevated, CRP improving, ferritin slightly up BNP 21 Procalcitonin negative BC x2 NGTD Chest x-ray showed pulmonary interstitial prominence bilaterally Continue Solu-Medrol, remdesivir (day 3 - seems to be improving, has Ronald Woodward few days of remdesivir left - pending course will decide on keeping for entire course vs early d/c), cough suppressant as needed Trend inflammatory  markers Supplemental oxygen as needed  AKI Continues to improve Likely due to above Last creatinine on care everywhere was 0.8 on 03/29/2019 Creatinine 3.03 on presentation Renal ultrasound unremarkable Discontinue IV fluids on 07/26/2019 Daily CMP  Transaminitis Resolved Elevated AST, ALT Likely due to viral infection Trend LFTs  Hypertension BP stable Continue metoprolol, continue to hold ACE inhibitor due to AKI  Diabetes mellitus type 2 with hyperglycemia A1c was 7.8 on 07/23/2019 SSI, Lantus (increased to 15 units), NovoLog 8 times daily, Accu-Cheks, hypoglycemic protocol Uncontrolled hyperglycemia Hold Metformin  OSA On CPAP at home Covid positive, would need oxygen while sleeping  History of unprovoked DVT Continue Xarelto  Hyperlipidemia Continue statin  Morbid obesity BMI 40.82 Lifestyle modification advised  DVT prophylaxis: xarelto  Code Status: full  Family Communication: will call wife - discussed with wife Disposition Plan: pending completion of remdesivir, clinical improvement   Consultants:   none  Procedures:   none  Antimicrobials:  Anti-infectives (From admission, onward)   Start     Dose/Rate Route Frequency Ordered Stop   07/26/19 1400  remdesivir 100 mg in sodium chloride 0.9 % 250 mL IVPB     100 mg 500 mL/hr over 30 Minutes Intravenous Every 24 hours 07/25/19 1140 07/30/19 1559   07/25/19 1400  remdesivir 200 mg in sodium chloride 0.9 % 250 mL IVPB     200 mg 500 mL/hr over 30 Minutes Intravenous Once 07/25/19 1140 07/25/19 1257         Subjective: C/o fatigue and notes his O2 has been fluctuating. Feels better than when he presented, but not to baseline  Objective: Vitals:   07/26/19 2039 07/27/19 0036 07/27/19 0438 07/27/19 0837  BP:  (!) 149/90 135/87 Marland Kitchen)  162/87  Pulse:   61   Resp:   17 18  Temp: 98.4 F (36.9 C) (!) 97.4 F (36.3 C) 97.9 F (36.6 C) 98.4 F (36.9 C)  TempSrc: Oral Axillary Oral  Oral  SpO2:   90% 91%  Weight:      Height:        Intake/Output Summary (Last 24 hours) at 07/27/2019 1237 Last data filed at 07/27/2019 1028 Gross per 24 hour  Intake 480 ml  Output 1000 ml  Net -520 ml   Filed Weights   07/23/19 1459 07/23/19 2343  Weight: (!) 136.5 kg (!) 136.5 kg    Examination:  General exam: Appears calm and comfortable  Respiratory system: unlabor, no accessory muscle use Cardiovascular system: RRR Gastrointestinal system: Abdomen is nondistended, soft and nontender.  Central nervous system: Alert and oriented. No focal neurological deficits. Extremities: Symmetric 5 x 5 power. Skin: No rashes, lesions or ulcers Psychiatry: Judgement and insight appear normal. Mood & affect appropriate.     Data Reviewed: I have personally reviewed following labs and imaging studies  CBC: Recent Labs  Lab 07/23/19 1448 07/24/19 0506 07/25/19 0429 07/26/19 0406 07/27/19 0017  WBC 8.8 5.2 15.2* 14.3* 10.9*  NEUTROABS 7.3 4.5 14.1* 12.3* 9.3*  HGB 14.4 13.6 12.8* 12.9* 12.2*  HCT 43.0 41.1 38.3* 39.3 37.6*  MCV 84.8 85.6 85.7 86.9 87.9  PLT 259 288 325 362 [id number]   Basic Metabolic Panel: Recent Labs  Lab 07/24/19 0506 07/25/19 0429 07/26/19 0406 07/27/19 0017 07/27/19 0917  NA 136 137 140 139 136  K 3.6 3.7 3.8 3.8 3.8  CL 102 105 105 105 103  CO2 21* 19* 20* 23 21*  GLUCOSE 332* 300* 267* 307* 353*  BUN 36* 33* 27* 24* 26*  CREATININE 2.49* 1.66* 1.26* 1.04 1.05  CALCIUM 7.9* 8.0* 8.2* 8.0* 7.9*  MG 2.3 2.1 2.0 1.8  --   PHOS 2.1* 2.7  --   --   --    GFR: Estimated Creatinine Clearance: 111.1 mL/min (by C-G formula based on SCr of 1.05 mg/dL). Liver Function Tests: Recent Labs  Lab 07/24/19 0506 07/25/19 0429 07/26/19 0406 07/27/19 0017 07/27/19 0917  AST 49* 45* 41 34 36  ALT 44 39 38 35 34  ALKPHOS 72 69 74 72 73  BILITOT 0.6 0.5 0.7 0.4 0.8  PROT 7.3 6.7 6.9 6.4* 6.6  ALBUMIN 3.2* 3.0* 2.9* 2.9* 3.2*   No results for input(s):  LIPASE, AMYLASE in the last 168 hours. No results for input(s): AMMONIA in the last 168 hours. Coagulation Profile: No results for input(s): INR, PROTIME in the last 168 hours. Cardiac Enzymes: No results for input(s): CKTOTAL, CKMB, CKMBINDEX, TROPONINI in the last 168 hours. BNP (last 3 results) No results for input(s): PROBNP in the last 8760 hours. HbA1C: No results for input(s): HGBA1C in the last 72 hours. CBG: Recent Labs  Lab 07/26/19 0836 07/26/19 1133 07/26/19 2007 07/27/19 0807 07/27/19 1117  GLUCAP 262* 311* 316* 281* 351*   Lipid Profile: No results for input(s): CHOL, HDL, LDLCALC, TRIG, CHOLHDL, LDLDIRECT in the last 72 hours. Thyroid Function Tests: No results for input(s): TSH, T4TOTAL, FREET4, T3FREE, THYROIDAB in the last 72 hours. Anemia Panel: Recent Labs    07/27/19 0017 07/27/19 0917  FERRITIN 324 396*   Sepsis Labs: Recent Labs  Lab 07/23/19 1448 07/23/19 1753  PROCALCITON  --  <0.10  LATICACIDVEN 1.6  --     Recent Results (from the past 240  hour(s))  Culture, blood (Routine X 2) w Reflex to ID Panel     Status: None (Preliminary result)   Collection Time: 07/23/19  5:32 PM   Specimen: BLOOD  Result Value Ref Range Status   Specimen Description BLOOD RIGHT ANTECUBITAL  Final   Special Requests   Final    BOTTLES DRAWN AEROBIC AND ANAEROBIC Blood Culture adequate volume   Culture   Final    NO GROWTH 3 DAYS Performed at [facility], [address] [place], [place] [address]    Report Status PENDING  Incomplete  Culture, blood (Routine X 2) w Reflex to ID Panel     Status: None (Preliminary result)   Collection Time: 07/23/19  5:37 PM   Specimen: BLOOD  Result Value Ref Range Status   Specimen Description BLOOD LEFT ANTECUBITAL  Final   Special Requests   Final    BOTTLES DRAWN AEROBIC ONLY Blood Culture results may not be optimal due to an inadequate volume of blood received in culture bottles   Culture   Final    NO GROWTH  3 DAYS Performed at [facility], [place] [address], [place], [place] [address]    Report Status PENDING  Incomplete  Novel Coronavirus, NAA (Hosp order, Send-out to Ref Lab; TAT 18-24 hrs     Status: None   Collection Time: 07/23/19  7:28 PM   Specimen: Nasopharyngeal Swab; Respiratory  Result Value Ref Range Status   SARS-CoV-2, NAA NOT DETECTED NOT DETECTED Final    Comment: (NOTE) This nucleic acid amplification test was developed and its performance characteristics determined by [employer]. Nucleic acid amplification tests include PCR and TMA. This test has not been FDA cleared or approved. This test has been authorized by FDA under an Emergency Use Authorization (EUA). This test is only authorized for the duration of time the declaration that circumstances exist justifying the authorization of the emergency use of in vitro diagnostic tests for detection of SARS-CoV-2 virus and/or diagnosis of COVID-19 infection under section 564(b)(1) of the Act, 21 U.S.C. [id number]) (1), unless the authorization is terminated or revoked sooner. When diagnostic testing is negative, the possibility of Jamieon Lannen false negative result should be considered in the context of Ohm Dentler patient's recent exposures and the presence of clinical signs and symptoms consistent with COVID-19. An individual without symptoms of COVID- 19 and who is not shedding SARS-CoV-2 vi rus would expect to have Kabeer Hoagland negative (not detected) result in this assay. Performed At: [facility] [address] [place], [place] [id number] Rush Farmer MD [id number]    [place]  Final    Comment: Performed at [facility], [place] [address] [place], [place] [address]  SARS Coronavirus 2 by RT PCR (hospital order, performed in [facility] hospital lab) Nasopharyngeal Nasopharyngeal Swab     Status: Abnormal   Collection Time: 07/25/19  8:00 AM   Specimen: Nasopharyngeal Swab  Result Value Ref  Range Status   SARS Coronavirus 2 POSITIVE (Ishita Mcnerney) NEGATIVE Final    Comment: RESULT CALLED TO, READ BACK BY AND VERIFIED WITH: K PACE,RN AT 0924 07/25/2019 BY L BENFIELD (NOTE) If result is NEGATIVE SARS-CoV-2 target nucleic acids are NOT DETECTED. The SARS-CoV-2 RNA is generally detectable in upper and lower  respiratory specimens during the acute phase of infection. The lowest  concentration of SARS-CoV-2 viral copies this assay can detect is 250  copies / mL. Lorree Millar negative result does not preclude SARS-CoV-2 infection  and  should not be used as the sole basis for treatment or other  patient management decisions.  Manasvi Dickard negative result may occur with  improper specimen collection / handling, submission of specimen other  than nasopharyngeal swab, presence of viral mutation(s) within the  areas targeted by this assay, and inadequate number of viral copies  (<250 copies / mL). Aleria Maheu negative result must be combined with clinical  observations, patient history, and epidemiological information. If result is POSITIVE SARS-CoV-2 target nucleic acids are DETECT ED. The SARS-CoV-2 RNA is generally detectable in upper and lower  respiratory specimens during the acute phase of infection.  Positive  results are indicative of active infection with SARS-CoV-2.  Clinical  correlation with patient history and other diagnostic information is  necessary to determine patient infection status.  Positive results do  not rule out bacterial infection or co-infection with other viruses. If result is PRESUMPTIVE POSTIVE SARS-CoV-2 nucleic acids MAY BE PRESENT.   Kianni Lheureux presumptive positive result was obtained on the submitted specimen  and confirmed on repeat testing.  While 2019 novel coronavirus  (SARS-CoV-2) nucleic acids may be present in the submitted sample  additional confirmatory testing may be necessary for epidemiological  and / or clinical management purposes  to differentiate between  SARS-CoV-2 and other  Sarbecovirus currently known to infect humans.  If clinically indicated additional testing with an alternate test  methodology ([id number] [id number]) is advised. The SARS-CoV-2 RNA is generally  detectable in upper and lower respiratory specimens during the acute  phase of infection. The expected result is Negative. Fact Sheet for Patients:  StrictlyIdeas.no Fact Sheet for Healthcare Providers: BankingDealers.co.za This test is not yet approved or cleared by the [place] FDA and has been authorized for detection and/or diagnosis of SARS-CoV-2 by FDA under an Emergency Use Authorization (EUA).  This EUA will remain in effect (meaning this test can be used) for the duration of the COVID-19 declaration under Section 564(b)(1) of the Act, 21 U.S.C. section 360bbb-3(b)(1), unless the authorization is terminated or revoked sooner. Performed at [facility], [place] [address] [place], [place] [address]          Radiology Studies: No results found.      Scheduled Meds:  atorvastatin  20 mg Oral Daily   feeding supplement (ENSURE ENLIVE)  237 mL Oral BID BM   insulin aspart  0-15 Units Subcutaneous TID WC   insulin aspart  0-5 Units Subcutaneous QHS   insulin aspart  8 Units Subcutaneous TID WC   insulin glargine  10 Units Subcutaneous Daily   methylPREDNISolone (SOLU-MEDROL) injection  0.5 mg/kg Intravenous Q12H   metoprolol succinate  50 mg Oral Daily   rivaroxaban  10 mg Oral Q supper   sodium chloride flush  3 mL Intravenous Once   vitamin C  500 mg Oral Daily   zinc sulfate  220 mg Oral Daily   Continuous Infusions:  remdesivir 100 mg in NS 250 mL 100 mg (07/26/19 1452)     LOS: 4 days    Time spent: over 30 min    Fayrene Helper, MD [facility] Pager AMION  If 7PM-7AM, please contact night-coverage [email or web address] Password [facility] 07/27/2019, 12:37 PM

## 2019-07-27 NOTE — Progress Notes (Signed)
Spoke with Dr. Shanon Brow in person regarding patient's blood pressure. MD aware, no new orders received.

## 2019-07-27 NOTE — TOC Initial Note (Signed)
Transition of Care [facility]) - Initial/Assessment Note    Patient Details  Name: Ronald Woodward MRN: [id number] Date of Birth: 1961/06/16  [facility] of [facility]) CM/SW Contact:    Ninfa Meeker, RN Phone Number: 07/27/2019, 9:56 AM  Clinical Narrative:  Patient  is a 58 y.o.malewith medical history significant ofmorbid obesity with BMI of 40, hypertension, diabetes mellitus, obstructive sleep apnea on CPAP, hyperlipidemia, unprovoked DVT-on Xarelto for many years, presents to emergency department due to worsening of shortness of breath, subjective fever, chills, cough since 4 to 5 days. Reports dry cough, decreased appetite, generalized body ache, loose stool, nausea, weakness. [facility], however has not had results back yet.  . In the ED, patient was tachycardic, hypoxic with oxygen saturation of 88%-placed on 2 L of oxygen via nasal cannula. Rapid COVID test resulted positive. Patient was transferred to [facility] on 07/26/19 for further treatment for COVID. Case manager will continue to monitor for needs.          Patient Goals and CMS Choice        Expected Discharge Plan and Services                                                Prior Living Arrangements/Services                       Activities of Daily Living Home Assistive Devices/Equipment: CPAP ADL Screening (condition at time of admission) Patient's cognitive ability adequate to safely complete daily activities?: Yes Is the patient deaf or have difficulty hearing?: No Does the patient have difficulty seeing, even when wearing glasses/contacts?: No Does the patient have difficulty concentrating, remembering, or making decisions?: No Patient able to express need for assistance with ADLs?: Yes Does the patient have difficulty dressing or bathing?: No Independently performs ADLs?: Yes (appropriate for developmental age) Does the patient have difficulty walking or  climbing stairs?: No Weakness of Legs: None Weakness of Arms/Hands: None  Permission Sought/Granted                  Emotional Assessment              Admission diagnosis:  Hypoxia [R09.02] AKI (acute kidney injury) ([place]) [N17.9] COVID-19 [U07.1] Patient Active Problem List   Diagnosis Date Noted  . Acute respiratory failure with hypoxia ([place]) 07/23/2019  . Diabetes ([place]) 07/23/2019  . OSA on CPAP 07/23/2019  . AKI (acute kidney injury) ([place]) 07/23/2019  . Hypokalemia 07/23/2019  . Essential hypertension 07/30/2017  . Morbid obesity ([place]) 04/29/2017  . SOB (shortness of breath) 04/29/2017  . Palpitation 07/07/2014  . DVT (deep venous thrombosis) ([place]) 07/07/2014   PCP:  [name], MD Pharmacy:   [place], [place] - [address] [facility] [place] [address] Phone: [phone] Fax: [phone]  [id number] [place], [place] [id number] [place] [id number] Phone: [phone] Fax: [phone]     Social Determinants of Health (SDOH) Interventions    Readmission Risk Interventions No flowsheet data found.

## 2019-07-27 NOTE — Progress Notes (Signed)
Walking O2 eval done. Pt does drop to 80's with ambulation. Sats recover quickly once back in room. 95% on room air before and after eval.

## 2019-07-27 NOTE — Progress Notes (Signed)
Paged Dr. Shanon Brow regarding patient's HTN SBP 175. Patient mentioned he takes losartan daily and is surprised it was not continued. Noted that in page to MD. Will monitor patient.

## 2019-07-27 NOTE — Progress Notes (Signed)
Patient ambulated in hallway for approx. 2 minutes on room air. Denies SOB or difficulty breathing. HR increased to 154 d/t activity, but patient remained asymptomatic. Returned to bed, vitals stable.

## 2019-07-27 NOTE — Progress Notes (Signed)
No response from first Amion page to MD, repaged regarding patient's SBP 175. Will continue to monitor.

## 2019-07-28 LAB — GLUCOSE, CAPILLARY
Glucose-Capillary: 216 mg/dL — ABNORMAL HIGH (ref 70–99)
Glucose-Capillary: 266 mg/dL — ABNORMAL HIGH (ref 70–99)
Glucose-Capillary: 233 mg/dL — ABNORMAL HIGH (ref 70–99)
Glucose-Capillary: 293 mg/dL — ABNORMAL HIGH (ref 70–99)

## 2019-07-28 LAB — CBC WITH DIFFERENTIAL/PLATELET
Abs Immature Granulocytes: 0.59 10*3/uL — ABNORMAL HIGH (ref 0.00–0.07)
Basophils Absolute: 0 10*3/uL (ref 0.0–0.1)
Basophils Relative: 0 %
Eosinophils Absolute: 0 10*3/uL (ref 0.0–0.5)
Eosinophils Relative: 0 %
HCT: 37.3 % — ABNORMAL LOW (ref 39.0–52.0)
Hemoglobin: 12.3 g/dL — ABNORMAL LOW (ref 13.0–17.0)
Immature Granulocytes: 6 %
Lymphocytes Relative: 7 %
Lymphs Abs: 0.7 10*3/uL (ref 0.7–4.0)
MCH: 28.3 pg (ref 26.0–34.0)
MCHC: 33 g/dL (ref 30.0–36.0)
MCV: 85.9 fL (ref 80.0–100.0)
Monocytes Absolute: 0.6 10*3/uL (ref 0.1–1.0)
Monocytes Relative: 5 %
Neutro Abs: 8.8 10*3/uL — ABNORMAL HIGH (ref 1.7–7.7)
Neutrophils Relative %: 82 %
Platelets: 398 10*3/uL (ref 150–400)
RBC: 4.34 MIL/uL (ref 4.22–5.81)
RDW: 13.7 % (ref 11.5–15.5)
WBC: 10.8 10*3/uL — ABNORMAL HIGH (ref 4.0–10.5)
nRBC: 0.2 % (ref 0.0–0.2)

## 2019-07-28 LAB — COMPREHENSIVE METABOLIC PANEL
ALT: 32 U/L (ref 0–44)
AST: 29 U/L (ref 15–41)
Albumin: 2.8 g/dL — ABNORMAL LOW (ref 3.5–5.0)
Alkaline Phosphatase: 62 U/L (ref 38–126)
Anion gap: 9 (ref 5–15)
BUN: 26 mg/dL — ABNORMAL HIGH (ref 6–20)
CO2: 24 mmol/L (ref 22–32)
Calcium: 8 mg/dL — ABNORMAL LOW (ref 8.9–10.3)
Chloride: 105 mmol/L (ref 98–111)
Creatinine, Ser: 0.92 mg/dL (ref 0.61–1.24)
GFR calc Af Amer: >60 mL/min (ref >60)
GFR calc non Af Amer: >60 mL/min (ref >60)
Glucose, Bld: 262 mg/dL — ABNORMAL HIGH (ref 70–99)
Potassium: 3.5 mmol/L (ref 3.5–5.1)
Sodium: 138 mmol/L (ref 135–145)
Total Bilirubin: 0.6 mg/dL (ref 0.3–1.2)
Total Protein: 5.9 g/dL — ABNORMAL LOW (ref 6.5–8.1)

## 2019-07-28 LAB — FERRITIN: Ferritin: 342 ng/mL — ABNORMAL HIGH (ref 24–336)

## 2019-07-28 LAB — CULTURE, BLOOD (ROUTINE X 2)
Culture: NO GROWTH
Culture: NO GROWTH
Special Requests: ADEQUATE

## 2019-07-28 LAB — D-DIMER, QUANTITATIVE: D-Dimer, Quant: 1.07 ug/mL-FEU — ABNORMAL HIGH (ref 0.00–0.50)

## 2019-07-28 LAB — C-REACTIVE PROTEIN: CRP: 1.1 mg/dL — ABNORMAL HIGH (ref <1.0)

## 2019-07-28 LAB — MAGNESIUM: Magnesium: 1.9 mg/dL (ref 1.7–2.4)

## 2019-07-28 MED ORDER — LOSARTAN POTASSIUM 25 MG PO TABS
100.0000 mg | ORAL_TABLET | Freq: Every day | ORAL | Status: DC
  Administered 2019-07-28 – 2019-07-29 (×2): 100 mg via ORAL
  Filled 2019-07-28: qty 4

## 2019-07-28 MED ORDER — HYDROCHLOROTHIAZIDE 25 MG PO TABS
25.0000 mg | ORAL_TABLET | Freq: Every day | ORAL | Status: DC
  Administered 2019-07-28 – 2019-07-29 (×2): 25 mg via ORAL
  Filled 2019-07-28 (×2): qty 1

## 2019-07-28 MED ORDER — INSULIN GLARGINE 100 UNIT/ML ~~LOC~~ SOLN
12.0000 [IU] | Freq: Two times a day (BID) | SUBCUTANEOUS | Status: DC
  Administered 2019-07-28 – 2019-07-29 (×2): 12 [IU] via SUBCUTANEOUS
  Filled 2019-07-28 (×3): qty 0.12

## 2019-07-28 NOTE — Progress Notes (Signed)
PROGRESS NOTE    Ronald Woodward  [id number] DOB: Nov 05, 1960 DOA: 07/23/2019 PCP: Ronald Cruel, MD   Brief Narrative:  HPI from Dr Ronald Woodward Ronald Woodward 58 y.o.malewith medical history significant ofmorbid obesity with BMI of 40, hypertension, diabetes mellitus, obstructive sleep apnea on CPAP, hyperlipidemia, unprovoked DVT-on Xarelto for many years, presents to emergency department due to worsening of shortness of breath, subjective fever, chills, cough since 4 to 5 days. Reports dry cough, decreased appetite, generalized body ache, loose stool, nausea, weakness. Was tested for [facility], however has not had results back yet. Pt is compliant with his Xarelto. In the ED, patient was tachycardic, hypoxic with oxygen saturation of 88%-placed on 2 L of oxygen via nasal cannula. CBC: No leukocytosis. CMP shows potassium of 3.0, AKI, lactiCOVID-19 negative, chest x-ray suggestive of atypical pneumonia including viral infection such as COVID-19.  Assessment & Plan:   Principal Problem:   Acute respiratory failure with hypoxia (HCC) Active Problems:   DVT (deep venous thrombosis) (HCC)   Morbid obesity (HCC)   Essential hypertension   Diabetes (HCC)   OSA on CPAP   AKI (acute kidney injury) ([place])   Hypokalemia   Acute hypoxic respiratory failure 2/2 COVID-19 pneumonia Currently afebrile, with leukocytosis (on steroids) Currently saturating above 90% on room air -> o2 drops to ~88 with ambulation COVID-19 initially negative, repeat rapid test positive Follow inflammatory markers daily -> improving BNP 21 Procalcitonin negative BC x2 NGTD Chest x-ray showed pulmonary interstitial prominence bilaterally Continue Solu-Medrol, remdesivir (day 4 - seems to be improving, has Ronald Woodward few days of remdesivir left - pending course will decide on keeping for entire course vs early d/c), cough suppressant as needed Trend inflammatory markers Supplemental  oxygen as needed  COVID-19 Labs  Recent Labs    07/26/19 0406 07/27/19 0017 07/27/19 0917 07/28/19 0135  DDIMER 0.68* 0.82* 1.03* 1.07*  FERRITIN 361* 324 396* 342*  CRP 1.8* 1.1*  --  1.1*    Lab Results  Component Value Date   SARSCOV2NAA POSITIVE (Ronald Woodward) 07/25/2019   SARSCOV2NAA NOT DETECTED 07/23/2019    AKI Continues to improve Likely due to above Last creatinine on care everywhere was 0.8 on 03/29/2019 Creatinine 3.03 on presentation Renal ultrasound unremarkable Discontinue IV fluids on 07/26/2019 Daily CMP  Transaminitis Resolved Elevated AST, ALT Likely due to viral infection Trend LFTs  Hypertension BP stable Continue metoprolol Add HCTZ and losartan back with resolution of AKI  Diabetes mellitus type 2 with hyperglycemia A1c was 7.8 on 07/23/2019 SSI, Lantus (increased to 12 units BID), NovoLog 8 times daily, Accu-Cheks, hypoglycemic protocol Uncontrolled hyperglycemia Hold Metformin  OSA On CPAP at home Covid positive, would need oxygen while sleeping  History of unprovoked DVT Continue Xarelto  Hyperlipidemia Continue statin  Morbid obesity BMI 40.82 Lifestyle modification advised  DVT prophylaxis: xarelto  Code Status: full  Family Communication: none at bedside Disposition Plan: pending completion of remdesivir, clinical improvement   Consultants:   none  Procedures:   none  Antimicrobials:  Anti-infectives (From admission, onward)   Start     Dose/Rate Route Frequency Ordered Stop   07/26/19 1400  remdesivir 100 mg in sodium chloride 0.9 % 250 mL IVPB     100 mg 500 mL/hr over 30 Minutes Intravenous Every 24 hours 07/25/19 1140 07/30/19 1559   07/25/19 1400  remdesivir 200 mg in sodium chloride 0.9 % 250 mL IVPB     200 mg 500 mL/hr  over 30 Minutes Intravenous Once 07/25/19 1140 07/25/19 1257         Subjective: Continued fatigue Not feeling back to baseline yet He's noticed his BP has been high   Objective: Vitals:   07/27/19 1520 07/27/19 2007 07/28/19 0425 07/28/19 0714  BP: 135/81 (!) 175/89 (!) 169/98 (!) 159/94  Pulse:  77 81 79  Resp: 16 (!) 21 (!) 27 18  Temp: 97.8 F (36.6 C) 98.9 F (37.2 C) 98.2 F (36.8 C) 98.1 F (36.7 C)  TempSrc: Oral Oral Oral Oral  SpO2: 94% 94% (!) 88% 90%  Weight:      Height:        Intake/Output Summary (Last 24 hours) at 07/28/2019 1554 Last data filed at 07/28/2019 1350 Gross per 24 hour  Intake 1320 ml  Output 1850 ml  Net -530 ml   Filed Weights   07/23/19 1459 07/23/19 2343  Weight: (!) 136.5 kg (!) 136.5 kg    Examination:  General: No acute distress. Cardiovascular: RRR Lungs: unlabored, no accessory muscle use Abdomen: Soft, nontender, nondistended  Neurological: Alert and oriented 3. Moves all extremities 4. Cranial nerves II through XII grossly intact. Skin: Warm and dry. No rashes or lesions. Extremities: No clubbing or cyanosis. No edema.   Data Reviewed: I have personally reviewed following labs and imaging studies  CBC: Recent Labs  Lab 07/24/19 0506 07/25/19 0429 07/26/19 0406 07/27/19 0017 07/28/19 0135  WBC 5.2 15.2* 14.3* 10.9* 10.8*  NEUTROABS 4.5 14.1* 12.3* 9.3* 8.8*  HGB 13.6 12.8* 12.9* 12.2* 12.3*  HCT 41.1 38.3* 39.3 37.6* 37.3*  MCV 85.6 85.7 86.9 87.9 85.9  PLT 288 325 362 347 [id number]   Basic Metabolic Panel: Recent Labs  Lab 07/24/19 0506 07/25/19 0429 07/26/19 0406 07/27/19 0017 07/27/19 0917 07/28/19 0135  NA 136 137 140 139 136 138  K 3.6 3.7 3.8 3.8 3.8 3.5  CL 102 105 105 105 103 105  CO2 21* 19* 20* 23 21* 24  GLUCOSE 332* 300* 267* 307* 353* 262*  BUN 36* 33* 27* 24* 26* 26*  CREATININE 2.49* 1.66* 1.26* 1.04 1.05 0.92  CALCIUM 7.9* 8.0* 8.2* 8.0* 7.9* 8.0*  MG 2.3 2.1 2.0 1.8  --  1.9  PHOS 2.1* 2.7  --   --   --   --    GFR: Estimated Creatinine Clearance: 126.8 mL/min (by C-G formula based on SCr of 0.92 mg/dL). Liver Function Tests: Recent Labs  Lab 07/25/19  0429 07/26/19 0406 07/27/19 0017 07/27/19 0917 07/28/19 0135  AST 45* 41 34 36 29  ALT 39 38 35 34 32  ALKPHOS 69 74 72 73 62  BILITOT 0.5 0.7 0.4 0.8 0.6  PROT 6.7 6.9 6.4* 6.6 5.9*  ALBUMIN 3.0* 2.9* 2.9* 3.2* 2.8*   No results for input(s): LIPASE, AMYLASE in the last 168 hours. No results for input(s): AMMONIA in the last 168 hours. Coagulation Profile: No results for input(s): INR, PROTIME in the last 168 hours. Cardiac Enzymes: No results for input(s): CKTOTAL, CKMB, CKMBINDEX, TROPONINI in the last 168 hours. BNP (last 3 results) No results for input(s): PROBNP in the last 8760 hours. HbA1C: No results for input(s): HGBA1C in the last 72 hours. CBG: Recent Labs  Lab 07/27/19 1117 07/27/19 1516 07/27/19 2044 07/28/19 0809 07/28/19 1121  GLUCAP 351* 428* 364* 216* 266*   Lipid Profile: No results for input(s): CHOL, HDL, LDLCALC, TRIG, CHOLHDL, LDLDIRECT in the last 72 hours. Thyroid Function Tests: No results for input(s):  TSH, T4TOTAL, FREET4, T3FREE, THYROIDAB in the last 72 hours. Anemia Panel: Recent Labs    07/27/19 0917 07/28/19 0135  FERRITIN 396* 342*   Sepsis Labs: Recent Labs  Lab 07/23/19 1448 07/23/19 1753  PROCALCITON  --  <0.10  LATICACIDVEN 1.6  --     Recent Results (from the past 240 hour(s))  Culture, blood (Routine X 2) w Reflex to ID Panel     Status: None   Collection Time: 07/23/19  5:32 PM   Specimen: BLOOD  Result Value Ref Range Status   Specimen Description BLOOD RIGHT ANTECUBITAL  Final   Special Requests   Final    BOTTLES DRAWN AEROBIC AND ANAEROBIC Blood Culture adequate volume   Culture   Final    NO GROWTH 5 DAYS Performed at [facility], [place] [address], [place], [place] [address]    Report Status 07/28/2019 FINAL  Final  Culture, blood (Routine X 2) w Reflex to ID Panel     Status: None   Collection Time: 07/23/19  5:37 PM   Specimen: BLOOD  Result Value Ref Range Status   Specimen Description BLOOD  LEFT ANTECUBITAL  Final   Special Requests   Final    BOTTLES DRAWN AEROBIC ONLY Blood Culture results may not be optimal due to an inadequate volume of blood received in culture bottles   Culture   Final    NO GROWTH 5 DAYS Performed at [facility], [place] [address] [place], [place] [address]    Report Status 07/28/2019 FINAL  Final  Novel Coronavirus, NAA (Hosp order, Send-out to Ref Lab; TAT 18-24 hrs     Status: None   Collection Time: 07/23/19  7:28 PM   Specimen: Nasopharyngeal Swab; Respiratory  Result Value Ref Range Status   SARS-CoV-2, NAA NOT DETECTED NOT DETECTED Final    Comment: (NOTE) This nucleic acid amplification test was developed and its performance characteristics determined by [employer]. Nucleic acid amplification tests include PCR and TMA. This test has not been FDA cleared or approved. This test has been authorized by FDA under an Emergency Use Authorization (EUA). This test is only authorized for the duration of time the declaration that circumstances exist justifying the authorization of the emergency use of in vitro diagnostic tests for detection of SARS-CoV-2 virus and/or diagnosis of COVID-19 infection under section 564(b)(1) of the Act, 21 U.S.C. [id number]) (1), unless the authorization is terminated or revoked sooner. When diagnostic testing is negative, the possibility of Margarethe Virgen false negative result should be considered in the context of Teana Lindahl patient's recent exposures and the presence of clinical signs and symptoms consistent with COVID-19. An individual without symptoms of COVID- 19 and who is not shedding SARS-CoV-2 vi rus would expect to have Zamyah Wiesman negative (not detected) result in this assay. Performed At: [facility] [address] [place], [place] [id number] Rush Farmer MD [id number]    [place]  Final    Comment: Performed at [facility], [place] [address], [place], [place] [address]  SARS  Coronavirus 2 by RT PCR (hospital order, performed in [facility] hospital lab) Nasopharyngeal Nasopharyngeal Swab     Status: Abnormal   Collection Time: 07/25/19  8:00 AM   Specimen: Nasopharyngeal Swab  Result Value Ref Range Status   SARS Coronavirus 2 POSITIVE (Jarrad Mclees) NEGATIVE Final    Comment: RESULT CALLED TO, READ BACK BY AND VERIFIED WITH: K PACE,RN AT [id number] 07/25/2019 BY L BENFIELD (NOTE) If result  is NEGATIVE SARS-CoV-2 target nucleic acids are NOT DETECTED. The SARS-CoV-2 RNA is generally detectable in upper and lower  respiratory specimens during the acute phase of infection. The lowest  concentration of SARS-CoV-2 viral copies this assay can detect is 250  copies / mL. Nakkia Mackiewicz negative result does not preclude SARS-CoV-2 infection  and should not be used as the sole basis for treatment or other  patient management decisions.  Micahel Omlor negative result may occur with  improper specimen collection / handling, submission of specimen other  than nasopharyngeal swab, presence of viral mutation(s) within the  areas targeted by this assay, and inadequate number of viral copies  (<250 copies / mL). Margaretha Mahan negative result must be combined with clinical  observations, patient history, and epidemiological information. If result is POSITIVE SARS-CoV-2 target nucleic acids are DETECT ED. The SARS-CoV-2 RNA is generally detectable in upper and lower  respiratory specimens during the acute phase of infection.  Positive  results are indicative of active infection with SARS-CoV-2.  Clinical  correlation with patient history and other diagnostic information is  necessary to determine patient infection status.  Positive results do  not rule out bacterial infection or co-infection with other viruses. If result is PRESUMPTIVE POSTIVE SARS-CoV-2 nucleic acids MAY BE PRESENT.   Angellica Maddison presumptive positive result was obtained on the submitted specimen  and confirmed on repeat testing.  While 2019 novel coronavirus   (SARS-CoV-2) nucleic acids may be present in the submitted sample  additional confirmatory testing may be necessary for epidemiological  and / or clinical management purposes  to differentiate between  SARS-CoV-2 and other Sarbecovirus currently known to infect humans.  If clinically indicated additional testing with an alternate test  methodology (LAB74 [id number]) is advised. The SARS-CoV-2 RNA is generally  detectable in upper and lower respiratory specimens during the acute  phase of infection. The expected result is Negative. Fact Sheet for Patients:  StrictlyIdeas.no Fact Sheet for Healthcare Providers: BankingDealers.co.za This test is not yet approved or cleared by the [place] FDA and has been authorized for detection and/or diagnosis of SARS-CoV-2 by FDA under an Emergency Use Authorization (EUA).  This EUA will remain in effect (meaning this test can be used) for the duration of the COVID-19 declaration under Section 564(b)(1) of the Act, 21 U.S.C. section 360bbb-3(b)(1), unless the authorization is terminated or revoked sooner. Performed at [facility], [place] [address], [place], [place] [address]          Radiology Studies: No results found.      Scheduled Meds: . atorvastatin  20 mg Oral Daily  . dexamethasone (DECADRON) injection  6 mg Intravenous Daily  . feeding supplement (ENSURE ENLIVE)  237 mL Oral BID BM  . hydrochlorothiazide  25 mg Oral Daily  . insulin aspart  0-20 Units Subcutaneous TID WC  . insulin aspart  0-5 Units Subcutaneous QHS  . insulin aspart  8 Units Subcutaneous TID WC  . insulin glargine  10 Units Subcutaneous BID  . losartan  100 mg Oral Daily  . metoprolol succinate  50 mg Oral Daily  . rivaroxaban  10 mg Oral Q supper  . sodium chloride flush  3 mL Intravenous Once  . vitamin C  500 mg Oral Daily  . zinc sulfate  220 mg Oral Daily   Continuous Infusions: . remdesivir 100 mg  in NS 250 mL 100 mg (07/27/19 1552)     LOS: 5 days    Time spent: over 30 min  Fayrene Helper, MD [facility] Pager AMION  If 7PM-7AM, please contact night-coverage [email or web address] Password [facility] 07/28/2019, 3:54 PM

## 2019-07-28 NOTE — Progress Notes (Addendum)
Received diabetes coordinator consult in regards to discharge diabetes medications recommendations.   Results for Ronald Woodward, Ronald Woodward (MRN [id number]) as of 07/28/2019 12:42  Ref. Range 07/23/2019 17:53  Hemoglobin A1C Latest Ref Range: 4.8 - 5.6 % 7.8 (H)  Note that HgbA1C is 7.8%.  Recommend Metformin 1000 mg BID and perhaps Glyburide 2.5 mg BID at discharge and have patient follow up with PCP.  Harvel Ricks RN BSN CDE Diabetes Coordinator Pager: [phone]  8am-5pm

## 2019-07-29 LAB — COMPREHENSIVE METABOLIC PANEL
ALT: 52 U/L — ABNORMAL HIGH (ref 0–44)
AST: 44 U/L — ABNORMAL HIGH (ref 15–41)
Albumin: 2.8 g/dL — ABNORMAL LOW (ref 3.5–5.0)
Alkaline Phosphatase: 64 U/L (ref 38–126)
Anion gap: 12 (ref 5–15)
BUN: 22 mg/dL — ABNORMAL HIGH (ref 6–20)
CO2: 27 mmol/L (ref 22–32)
Calcium: 8.1 mg/dL — ABNORMAL LOW (ref 8.9–10.3)
Chloride: 99 mmol/L (ref 98–111)
Creatinine, Ser: 0.79 mg/dL (ref 0.61–1.24)
GFR calc Af Amer: >60 mL/min (ref >60)
GFR calc non Af Amer: >60 mL/min (ref >60)
Glucose, Bld: 155 mg/dL — ABNORMAL HIGH (ref 70–99)
Potassium: 2.8 mmol/L — ABNORMAL LOW (ref 3.5–5.1)
Sodium: 138 mmol/L (ref 135–145)
Total Bilirubin: 1 mg/dL (ref 0.3–1.2)
Total Protein: 6.2 g/dL — ABNORMAL LOW (ref 6.5–8.1)

## 2019-07-29 LAB — CBC
HCT: 39.4 % (ref 39.0–52.0)
Hemoglobin: 13.2 g/dL (ref 13.0–17.0)
MCH: 28.7 pg (ref 26.0–34.0)
MCHC: 33.5 g/dL (ref 30.0–36.0)
MCV: 85.7 fL (ref 80.0–100.0)
Platelets: 430 10*3/uL — ABNORMAL HIGH (ref 150–400)
RBC: 4.6 MIL/uL (ref 4.22–5.81)
RDW: 13.8 % (ref 11.5–15.5)
WBC: 10.2 10*3/uL (ref 4.0–10.5)
nRBC: 0.2 % (ref 0.0–0.2)

## 2019-07-29 LAB — FERRITIN: Ferritin: 372 ng/mL — ABNORMAL HIGH (ref 24–336)

## 2019-07-29 LAB — GLUCOSE, CAPILLARY
Glucose-Capillary: 139 mg/dL — ABNORMAL HIGH (ref 70–99)
Glucose-Capillary: 279 mg/dL — ABNORMAL HIGH (ref 70–99)

## 2019-07-29 LAB — C-REACTIVE PROTEIN: CRP: 1.2 mg/dL — ABNORMAL HIGH (ref <1.0)

## 2019-07-29 LAB — MAGNESIUM: Magnesium: 1.7 mg/dL (ref 1.7–2.4)

## 2019-07-29 LAB — D-DIMER, QUANTITATIVE: D-Dimer, Quant: 1.34 ug/mL-FEU — ABNORMAL HIGH (ref 0.00–0.50)

## 2019-07-29 MED ORDER — POTASSIUM CHLORIDE CRYS ER 20 MEQ PO TBCR
40.0000 meq | EXTENDED_RELEASE_TABLET | ORAL | Status: AC
  Administered 2019-07-29 (×2): 40 meq via ORAL
  Filled 2019-07-29 (×2): qty 2

## 2019-07-29 MED ORDER — SODIUM CHLORIDE 0.9 % IV SOLN
100.0000 mg | INTRAVENOUS | Status: AC
  Administered 2019-07-29: 100 mg via INTRAVENOUS
  Filled 2019-07-29: qty 20

## 2019-07-29 MED ORDER — BLOOD GLUCOSE MONITOR KIT: PACK | 0 refills | Status: AC

## 2019-07-29 MED ORDER — GLIPIZIDE 5 MG PO TABS: 2.5000 mg | ORAL_TABLET | Freq: Every day | ORAL | 0 refills | Status: DC

## 2019-07-29 MED ORDER — POTASSIUM CHLORIDE ER 10 MEQ PO TBCR: 20.0000 meq | EXTENDED_RELEASE_TABLET | Freq: Every day | ORAL | 0 refills | Status: AC

## 2019-07-29 MED ORDER — GLIPIZIDE 5 MG PO TABS: 2.5000 mg | ORAL_TABLET | Freq: Two times a day (BID) | ORAL | 0 refills | Status: DC

## 2019-07-29 MED ORDER — METFORMIN HCL 1000 MG PO TABS: 1000.0000 mg | ORAL_TABLET | Freq: Two times a day (BID) | ORAL | 0 refills | Status: AC

## 2019-07-29 MED FILL — FREESTYLE LANCETS: 90 days supply | Qty: 100 | Fill #0

## 2019-07-29 MED FILL — XARELTO 10 MG TABLET: 10 | 90 days supply | Qty: 90 | Fill #0

## 2019-07-29 MED FILL — FREESTYLE LITE METER: 20 days supply | Qty: 1 | Fill #0

## 2019-07-29 MED FILL — LOSARTAN-HCTZ 100-25 MG TAB: 100-25 | 30 days supply | Qty: 30 | Fill #0

## 2019-07-29 MED FILL — FREESTYLE LITE TEST STRIP: 90 days supply | Qty: 100 | Fill #0

## 2019-07-29 MED FILL — METOPROLOL TARTRATE 50 MG T: 50 | 90 days supply | Qty: 90 | Fill #0

## 2019-07-29 NOTE — Progress Notes (Signed)
Spoke with patient on the phone. Patient states that he has had diabetes for 7-8 years. Taking Metformin at home. His A1C has been 6.1% in the past.  Discussed his A1C now of 7.8% and what that means. Encouraged him to get a blood glucose meter, strips, and lancets and check blood sugars up to twice a day. Be sure to record the results and take to PCP for follow up.  Added diabetes Exit Notes to be given with discharge papers.   Harvel Ricks RN BSN CDE Diabetes Coordinator Pager: [phone]  8am-5pm

## 2019-07-29 NOTE — Discharge Summary (Addendum)
Physician Discharge Summary  Ronald Woodward KGU:[id number] DOB: 01-Sep-1961 DOA: 07/23/2019  PCP: Ronald Cruel, MD  Admit date: 07/23/2019 Discharge date: 07/29/2019  Time spent: 40 minutes  Recommendations for Outpatient Follow-up:  1. Follow outpatient CBC/CMP 2. Quarantine per [employer] recommendations 3. Follow BG's outpatient -> increased metformin to 1000 mg BID 4. Follow BP and kidney function in about 2 weeks   5. Discharged with K supplementation due to hypokalemia noted on day of discharge - follow outpatient  Discharge Diagnoses:  Principal Problem:   Acute respiratory failure with hypoxia ([place]) Active Problems:   DVT (deep venous thrombosis) ([place])   Morbid obesity ([place])   Essential hypertension   Diabetes ([place])   OSA on CPAP   AKI (acute kidney injury) ([place])   Hypokalemia   Discharge Condition: stable  Diet recommendation: diabetic  Filed Weights   07/23/19 1459 07/23/19 2343  Weight: (!) 136.5 kg (!) 136.5 kg    History of present illness:  HPI from Dr Ronald Woodward 58 y.o.malewith medical history significant ofmorbid obesity with BMI of 40, hypertension, diabetes mellitus, obstructive sleep apnea on CPAP, hyperlipidemia, unprovoked DVT-on Xarelto for many years, presents to emergency department due to worsening of shortness of breath, subjective fever, chills, cough since 4 to 5 days. Reports dry cough, decreased appetite, generalized body ache, loose stool, nausea, weakness. [facility], however has not had results back yet. Pt is compliant with his Xarelto. In the ED, patient was tachycardic, hypoxic with oxygen saturation of 88%-placed on 2 L of oxygen via nasal cannula. CBC: No leukocytosis. CMP shows potassium of 3.0, AKI, lactiCOVID-19 negative, chest x-ray suggestive of atypical pneumonia including viral infection such as COVID-19.  He was admitted for COVID 19 pneumonia and AKI.  He improved on  steroids and remdesivir.  He was improved on 11/6 with minimal symptoms and was discharged with return precautions.  His BG's were significantly elevated during hospitalization.  Metformin increased to 1000 mg BID at discharge.  Hospital Course:  Acute hypoxic respiratory failure 2/2 COVID-19 pneumonia Currently afebrile, with leukocytosis (on steroids) Currently saturating above 90% on room air -> o2 drops to ~88 with ambulation - able to maintain sats above 88% prior to discharge - comfortable, without significant SOB per nursing report Follow inflammatory markers daily -> relatively stable BNP 21 Procalcitonin negative BC [id number] Chest x-ray showed pulmonary interstitial prominence bilaterally Continue Solu-Medrol, remdesivir (day 5-) Given improvement with improved subjective SOB and symptoms, will discharge 11/6 with return precautions Trend inflammatory markers Supplemental oxygen as needed  COVID-19 Labs  Recent Labs    07/27/19 0017 07/27/19 0917 07/28/19 0135 07/29/19 0539  DDIMER 0.82* 1.03* 1.07* 1.34*  FERRITIN 324 396* 342* 372*  CRP 1.1*  --  1.1* 1.2*    Lab Results  Component Value Date   SARSCOV2NAA POSITIVE (Ronald Woodward) 07/25/2019   SARSCOV2NAA NOT DETECTED 07/23/2019   AKI Likely due to above  Last creatinine on care everywhere was 0.8 on 03/29/2019 Creatinine 3.03 on presentation Renal ultrasound unremarkable DiscontinueIV fluidson 07/26/2019 Renal function normalized prior to discharge  Hypokalemia: replace today, follow outpatient - supplement with 20 daily outpatient   Transaminitis Mild at time of discharge, follow outpatient Likely due to viral infection Trend LFTs  Hypertension BP stable Continue metoprolol Add HCTZ and losartan back with resolution of AKI Follow labs outpatient after quarantine  Diabetes mellitus type 2 with hyperglycemia A1c was 7.8 on 07/23/2019 Resume metformin  at discharge and increase to 1000 mg BID Needs close  follow up with PCP for diabetes management  OSA On CPAP at home  History of unprovoked DVT Continue Xarelto  Hyperlipidemia Continue statin  Morbid obesity BMI 40.82 Lifestyle modification advised  Procedures: none  Consultations:  none  Discharge Exam: Vitals:   07/29/19 0430 07/29/19 0736  BP: (!) 159/91 (!) 150/95  Pulse: 63 72  Resp: 18 19  Temp: 98.5 F (36.9 C) 98.4 F (36.9 C)  SpO2: 93% 92%   Feeling well. SOB improved. Comfortable ambulating around unit. Discussed d/c plan with wife prior to discharge.  General: No acute distress. Cardiovascular: RRR Lungs: unlabored Abdomen: Soft, nontender, nondistended  Neurological: Alert and oriented 3. Moves all extremities 4 . Cranial nerves II through XII grossly intact. Skin: Warm and dry. No rashes or lesions. Extremities: No clubbing or cyanosis. No edema.  Discharge Instructions   Discharge Instructions    Call MD for:  difficulty breathing, headache or visual disturbances   Complete by: As directed    Call MD for:  extreme fatigue   Complete by: As directed    Call MD for:  hives   Complete by: As directed    Call MD for:  persistant dizziness or light-headedness   Complete by: As directed    Call MD for:  persistant nausea and vomiting   Complete by: As directed    Call MD for:  redness, tenderness, or signs of infection (pain, swelling, redness, odor or green/yellow discharge around incision site)   Complete by: As directed    Call MD for:  severe uncontrolled pain   Complete by: As directed    Call MD for:  temperature >100.4   Complete by: As directed    Diet - low sodium heart healthy   Complete by: As directed    Discharge instructions   Complete by: As directed    You were seen for COVID 19 pneumonia.  You've improved with steroids and remdesivir.  You should quarantine for 20 days since your symptoms started (with at least 24 hours without symptoms without using additional  medications).  COVID 19 is an unpredictable illness.  Return for new, recurrent, or worsening symptoms.  Your blood sugars were high and your A1c is above goal.  We'll increase your metformin to 1000 mg twice daily.    Check your blood sugars first thing in the morning.  Please keep track of these blood sugars and follow up with your PCP to determine if you need any adjustment to your regimen.  We'll send you home with Kobyn Torpey glucometer.  Your potassium was low today, I think this was related to your HCTZ.  We'll send you with some supplemental potassium.  Your kidney function is improved, we'll restart your blood pressure medications.  Please follow up repeat labs within Manpreet Strey couple of weeks (when you're done with quarantine).  Return for new, recurrent, or worsening symptoms.  Please ask your PCP to request records from this hospitalization so they know what was done and what the next steps will be.   Increase activity slowly   Complete by: As directed    MyChart COVID-19 home monitoring program   Complete by: Jul 29, 2019    Is the patient willing to use the [place] for home monitoring?: Yes     Allergies as of 07/29/2019   No Known Allergies     Medication List    STOP taking these medications  chlorthalidone 25 MG tablet Commonly known as: HYGROTON     TAKE these medications   acetaminophen 500 MG tablet Commonly known as: TYLENOL Take 500-1,000 mg by mouth every 6 (six) hours as needed for headache (pain).   atorvastatin 80 MG tablet Commonly known as: LIPITOR Take 80 mg by mouth daily.   bismuth subsalicylate 449 QP/59FM suspension Commonly known as: PEPTO BISMOL Take 30 mLs by mouth every 6 (six) hours as needed for indigestion (upset stomach).   blood glucose meter kit and supplies Kit Dispense based on patient and insurance preference. Use up to four times daily as directed. (FOR ICD-9 250.00, 250.01).   DAYQUIL PO Take 30 mLs by mouth every 6 (six) hours as  needed (cough/congestion).   GLUCOSAMINE PO Take 1 tablet by mouth daily.   loratadine 10 MG tablet Commonly known as: CLARITIN Take 10 mg by mouth daily.   losartan-hydrochlorothiazide 100-25 MG tablet Commonly known as: HYZAAR Take 1 tablet by mouth daily.   metFORMIN 1000 MG tablet Commonly known as: GLUCOPHAGE Take 1 tablet (1,000 mg total) by mouth 2 (two) times daily with Oluwafemi Villella meal. What changed: when to take this   metoprolol succinate 50 MG 24 hr tablet Commonly known as: TOPROL-XL TAKE 1 TABLET (50 MG TOTAL) BY MOUTH DAILY.   metoprolol tartrate 50 MG tablet Commonly known as: LOPRESSOR Take 50 mg by mouth daily.   NYQUIL PO Take 30 mLs by mouth every 6 (six) hours as needed (cough/ congestion).   potassium chloride 10 MEQ tablet Commonly known as: Klor-Con 10 Take 2 tablets (20 mEq total) by mouth daily for 14 days.   rivaroxaban 10 MG Tabs tablet Commonly known as: XARELTO Take 10 mg by mouth daily.   sildenafil 20 MG tablet Commonly known as: REVATIO Take 40 mg by mouth daily as needed (erectile dysfunction).      No Known Allergies    The results of significant diagnostics from this hospitalization (including imaging, microbiology, ancillary and laboratory) are listed below for reference.    Significant Diagnostic Studies: Dg Chest 2 View  Result Date: 07/23/2019 CLINICAL DATA:  Cough and wheezing. EXAM: CHEST - 2 VIEW COMPARISON:  None. FINDINGS: The heart size and mediastinal contours are within normal limits. Pulmonary interstitial prominence bilaterally and suggestion peripheral infiltrate/opacity in the left mid to lower lung. Scarring/atelectasis at the right lung base. No overt airspace edema, pleural effusions or pneumothorax. No focal nodule. The visualized skeletal structures are unremarkable. IMPRESSION: Pulmonary interstitial prominence bilaterally and suggestion of potential peripheral infiltrate in the left mid to lower lung. This can be  seen with atypical pneumonia including viral infections such as COVID-19. Electronically Signed   By: Aletta Edouard M.D.   On: 07/23/2019 14:09   US Renal  Result Date: 07/24/2019 CLINICAL DATA:  Acute kidney insufficiency. EXAM: RENAL / URINARY TRACT ULTRASOUND COMPLETE COMPARISON:  None. FINDINGS: Right Kidney: Renal measurements: 12.0 x 6.0 x 4.9 cm = volume: 183 mL . Echogenicity within normal limits. No mass or hydronephrosis visualized. Left Kidney: Renal measurements: 12.7 x 6.4 x 5.5 cm = volume: 231 mL. Echogenicity within normal limits. No mass or hydronephrosis visualized. Bladder: Appears normal for degree of bladder distention. IMPRESSION: Normal bilateral renal ultrasound. No acute or focal lesion to explain renal insufficiency. Electronically Signed   By: San Morelle M.D.   On: 07/24/2019 10:37    Microbiology: Recent Results (from the past 240 hour(s))  Culture, blood (Routine X 2) w Reflex to ID Panel  Status: None   Collection Time: 07/23/19  5:32 PM   Specimen: BLOOD  Result Value Ref Range Status   Specimen Description BLOOD RIGHT ANTECUBITAL  Final   Special Requests   Final    BOTTLES DRAWN AEROBIC AND ANAEROBIC Blood Culture adequate volume   Culture   Final    NO GROWTH 5 DAYS Performed at [facility], [address], [place], [place] [address]    Report Status 07/28/2019 FINAL  Final  Culture, blood (Routine X 2) w Reflex to ID Panel     Status: None   Collection Time: 07/23/19  5:37 PM   Specimen: BLOOD  Result Value Ref Range Status   Specimen Description BLOOD LEFT ANTECUBITAL  Final   Special Requests   Final    BOTTLES DRAWN AEROBIC ONLY Blood Culture results may not be optimal due to an inadequate volume of blood received in culture bottles   Culture   Final    NO GROWTH 5 DAYS Performed at [facility], [place] [address], [place], [place] [address]    Report Status 07/28/2019 FINAL  Final  Novel Coronavirus, NAA (Hosp order,  Send-out to Ref Lab; TAT 18-24 hrs     Status: None   Collection Time: 07/23/19  7:28 PM   Specimen: Nasopharyngeal Swab; Respiratory  Result Value Ref Range Status   SARS-CoV-2, NAA NOT DETECTED NOT DETECTED Final    Comment: (NOTE) This nucleic acid amplification test was developed and its performance characteristics determined by [employer], [employer] and [employer]. Nucleic acid amplification tests include PCR and TMA. This test has not been FDA cleared or approved. This test has been authorized by FDA under an Emergency Use Authorization (EUA). This test is only authorized for the duration of time the declaration that circumstances exist justifying the authorization of the emergency use of in vitro diagnostic tests for detection of SARS-CoV-2 virus and/or diagnosis of COVID-19 infection under section 564(b)(1) of the Act, 21 U.S.C. 741ULA-4(T) (1), unless the authorization is terminated or revoked sooner. When diagnostic testing is negative, the possibility of Elenora Hawbaker false negative result should be considered in the context of Xoe Hoe patient's recent exposures and the presence of clinical signs and symptoms consistent with COVID-19. An individual without symptoms of COVID- 19 and who is not shedding SARS-CoV-2 vi rus would expect to have Kamorah Nevils negative (not detected) result in this assay. Performed At: [facility] [address] [place], [place] [id number] Rush Farmer MD YY:[id number]    [place]  Final    Comment: Performed at [facility], [place] [address] [place], [place] [address]  SARS Coronavirus 2 by RT PCR (hospital order, performed in [facility] hospital lab) Nasopharyngeal Nasopharyngeal Swab     Status: Abnormal   Collection Time: 07/25/19  8:00 AM   Specimen: Nasopharyngeal Swab  Result Value Ref Range Status   SARS Coronavirus 2 POSITIVE (Chadwick Reiswig) NEGATIVE Final    Comment: RESULT CALLED TO, READ BACK BY AND VERIFIED WITH: K PACE,RN AT 0924 07/25/2019 BY L  BENFIELD (NOTE) If result is NEGATIVE SARS-CoV-2 target nucleic acids are NOT DETECTED. The SARS-CoV-2 RNA is generally detectable in upper and lower  respiratory specimens during the acute phase of infection. The lowest  concentration of SARS-CoV-2 viral copies this assay can detect is 250  copies / mL. Tiara Maultsby negative result does not preclude SARS-CoV-2 infection  and should not be used as the sole basis for treatment or other  patient management decisions.  Mirella Gueye  negative result may occur with  improper specimen collection / handling, submission of specimen other  than nasopharyngeal swab, presence of viral mutation(s) within the  areas targeted by this assay, and inadequate number of viral copies  (<250 copies / mL). Moises Terpstra negative result must be combined with clinical  observations, patient history, and epidemiological information. If result is POSITIVE SARS-CoV-2 target nucleic acids are DETECT ED. The SARS-CoV-2 RNA is generally detectable in upper and lower  respiratory specimens during the acute phase of infection.  Positive  results are indicative of active infection with SARS-CoV-2.  Clinical  correlation with patient history and other diagnostic information is  necessary to determine patient infection status.  Positive results do  not rule out bacterial infection or co-infection with other viruses. If result is PRESUMPTIVE POSTIVE SARS-CoV-2 nucleic acids MAY BE PRESENT.   Atilla Zollner presumptive positive result was obtained on the submitted specimen  and confirmed on repeat testing.  While 2019 novel coronavirus  (SARS-CoV-2) nucleic acids may be present in the submitted sample  additional confirmatory testing may be necessary for epidemiological  and / or clinical management purposes  to differentiate between  SARS-CoV-2 and other Sarbecovirus currently known to infect humans.  If clinically indicated additional testing with an alternate test  methodology (LAB74 [id number]) is advised. The  SARS-CoV-2 RNA is generally  detectable in upper and lower respiratory specimens during the acute  phase of infection. The expected result is Negative. Fact Sheet for Patients:  StrictlyIdeas.no Fact Sheet for Healthcare Providers: BankingDealers.co.za This test is not yet approved or cleared by the [place] FDA and has been authorized for detection and/or diagnosis of SARS-CoV-2 by FDA under an Emergency Use Authorization (EUA).  This EUA will remain in effect (meaning this test can be used) for the duration of the COVID-19 declaration under Section 564(b)(1) of the Act, 21 U.S.C. section 360bbb-3(b)(1), unless the authorization is terminated or revoked sooner. Performed at [facility], [place] [address] [place], [place] [address]      Labs: Basic Metabolic Panel: Recent Labs  Lab 07/24/19 0506 07/25/19 0429 07/26/19 0406 07/27/19 0017 07/27/19 0917 07/28/19 0135 07/29/19 0539  NA 136 137 140 139 136 138 138  K 3.6 3.7 3.8 3.8 3.8 3.5 2.8*  CL 102 105 105 105 103 105 99  CO2 21* 19* 20* 23 21* 24 27  GLUCOSE 332* 300* 267* 307* 353* 262* 155*  BUN 36* 33* 27* 24* 26* 26* 22*  CREATININE 2.49* 1.66* 1.26* 1.04 1.05 0.92 0.79  CALCIUM 7.9* 8.0* 8.2* 8.0* 7.9* 8.0* 8.1*  MG 2.3 2.1 2.0 1.8  --  1.9 1.7  PHOS 2.1* 2.7  --   --   --   --   --    Liver Function Tests: Recent Labs  Lab 07/26/19 0406 07/27/19 0017 07/27/19 0917 07/28/19 0135 07/29/19 0539  AST 41 34 36 29 44*  ALT 38 35 34 32 52*  ALKPHOS 74 72 73 62 64  BILITOT 0.7 0.4 0.8 0.6 1.0  PROT 6.9 6.4* 6.6 5.9* 6.2*  ALBUMIN 2.9* 2.9* 3.2* 2.8* 2.8*   No results for input(s): LIPASE, AMYLASE in the last 168 hours. No results for input(s): AMMONIA in the last 168 hours. CBC: Recent Labs  Lab 07/24/19 0506 07/25/19 0429 07/26/19 0406 07/27/19 0017 07/28/19 0135 07/29/19 0539  WBC 5.2 15.2* 14.3* 10.9* 10.8* 10.2  NEUTROABS 4.5 14.1* 12.3*  9.3* 8.8*  --   HGB 13.6 12.8* 12.9* 12.2* 12.3* 13.2  HCT 41.1 38.3* 39.3 37.6* 37.3* 39.4  MCV 85.6 85.7 86.9 87.9 85.9 85.7  PLT 288 325 362 347 398 430*   Cardiac Enzymes: No results for input(s): CKTOTAL, CKMB, CKMBINDEX, TROPONINI in the last 168 hours. BNP: BNP (last 3 results) Recent Labs    07/23/19 1753  BNP 21.1    ProBNP (last 3 results) No results for input(s): PROBNP in the last 8760 hours.  CBG: Recent Labs  Lab 07/28/19 1121 07/28/19 1709 07/28/19 2041 07/29/19 0735 07/29/19 1153  GLUCAP 266* 233* 293* 139* 279*       Signed:  Fayrene Helper MD.  [facility] 07/29/2019, 12:17 PM

## 2019-07-29 NOTE — Plan of Care (Signed)
  Problem: Education: Goal: Knowledge of risk factors and measures for prevention of condition will improve Outcome: Progressing   Problem: Coping: Goal: Psychosocial and spiritual needs will be supported Outcome: Progressing   Problem: Respiratory: Goal: Will maintain a patent airway Outcome: Progressing Goal: Complications related to the disease process, condition or treatment will be avoided or minimized Outcome: Progressing   

## 2019-08-01 ENCOUNTER — Other Ambulatory Visit: Payer: Self-pay | Admitting: *Deleted

## 2019-08-01 ENCOUNTER — Encounter: Payer: Self-pay | Admitting: *Deleted

## 2019-08-01 NOTE — Patient Outreach (Signed)
[facility]) Care Management  08/01/2019  Ronald Woodward Jan 30, 1961 [id number]  Transition of care call/case closure   Referral received:07/25/19 Initial outreach: 08/01/19 Insurance: [employer]    Subjective: Initial successful telephone call to patient's preferred number in order to complete transition of care assessment; 2 HIPAA identifiers verified. Explained purpose of call and completed transition of care assessment.  States He is doing better, just tired. He discussed being short of breath when up walking but improves with rest, reports walking outside on today. Marland Kitchen He discussed monitoring oxygen saturations and reading being 92-93%, he denies having temperature elevation. He reports appetite has improved, denies loss of taste, nausea or diarrhea tolerating diet. He discussed loosing 25 pounds during the last 2 weeks since symptoms began, stating he needed to lose weight but not like this . Patient reports resting well at night using cpap.  He  denies bowel or bladder problems. Patient spouse is  assisting with his recovery.  He discussed  ongoing health issues diabetes, for the last 7 to 8 years taking metformin and has not been monitoring blood sugars. He is interested information on chronic condition management programs.. Patient reports that his wife is a Marine scientist and is helping him keep things straight. Patient discussed being prescribed CBG meter at discharge, and anticipates it being received by mail from pharmacy on today, he states his wife is and expert on use of meter and will be able to assist him, they [facility] other [facility] so they can [facility] by for  pick up as wife is also covid positive.    Reviewed low blood sugar, readings,  symptoms how to treat and notifying MD sooner.  Discussed checking blood pressure at home he states that his wife will be able to assist him with that.   He states that he uses a  [facility] outpatient  pharmacy.  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    Objective:  Ronald Woodward was hospitalized [facility]  from 2020 for Acute respiratory failure,Covid 19 pneumonia, AKI  Comorbidities include: Type 2 Diabetes A1c 7.8 on 07/23/19) .Hypertension, OSA , DVT.   He  was discharged to home on 07/29/19 without the need for home health services or DME.   Assessment:  Patient voices good understanding of all discharge instructions.  See transition of care flowsheet for assessment details.   Plan:  Reviewed hospital discharge diagnosis of   and discharge treatment plan using hospital discharge instructions, assessing medication adherence, reviewing problems requiring provider notification, and discussing the importance of follow up with surgeon, primary care provider and/or specialists as directed. Reviewed [employer] announcements that all [place] members will receive the Healthy Lifestyle Premium rate in 2021.  Reviewed 's chronic disease management program benefit with [employer] , patient interested , provided contact number [phone] and encouraged to call.  Offered additional education information on diabetes , he declined stated he already had education material at  hospital  discharge.  No ongoing care management needs identified so will close case to [place] Management services and route successful outreach letter with [place] Management pamphlet and 24 Hour Nurse Line Magnet to  [place] Management clinical pool to be mailed to patient's home address.  Joylene Draft, RN, [place] Management Coordinator  [phone]-  Mobile [phone]- Toll Free Main Office .

## 2019-08-10 DIAGNOSIS — D72829 Elevated white blood cell count, unspecified: Secondary | ICD-10-CM | POA: Diagnosis not present

## 2019-08-10 DIAGNOSIS — B342 Coronavirus infection, unspecified: Secondary | ICD-10-CM | POA: Diagnosis not present

## 2019-08-10 DIAGNOSIS — J189 Pneumonia, unspecified organism: Secondary | ICD-10-CM | POA: Diagnosis not present

## 2019-08-10 DIAGNOSIS — N179 Acute kidney failure, unspecified: Secondary | ICD-10-CM | POA: Diagnosis not present

## 2019-08-10 DIAGNOSIS — Z09 Encounter for follow-up examination after completed treatment for conditions other than malignant neoplasm: Secondary | ICD-10-CM | POA: Diagnosis not present

## 2019-08-10 DIAGNOSIS — E119 Type 2 diabetes mellitus without complications: Secondary | ICD-10-CM | POA: Diagnosis not present

## 2019-08-12 MED FILL — BENZONATATE 200 MG CAPS: 200 | 30 days supply | Qty: 90 | Fill #0

## 2019-08-12 MED FILL — metFORMIN HCL 1000 MG TABS: 1000 | 90 days supply | Qty: 180 | Fill #0

## 2019-08-15 DIAGNOSIS — D72829 Elevated white blood cell count, unspecified: Secondary | ICD-10-CM | POA: Diagnosis not present

## 2019-08-15 DIAGNOSIS — E119 Type 2 diabetes mellitus without complications: Secondary | ICD-10-CM | POA: Diagnosis not present

## 2019-08-15 DIAGNOSIS — B342 Coronavirus infection, unspecified: Secondary | ICD-10-CM | POA: Diagnosis not present

## 2019-08-15 DIAGNOSIS — Z09 Encounter for follow-up examination after completed treatment for conditions other than malignant neoplasm: Secondary | ICD-10-CM | POA: Diagnosis not present

## 2019-08-15 DIAGNOSIS — N179 Acute kidney failure, unspecified: Secondary | ICD-10-CM | POA: Diagnosis not present

## 2019-08-15 DIAGNOSIS — J189 Pneumonia, unspecified organism: Secondary | ICD-10-CM | POA: Diagnosis not present

## 2019-08-29 MED FILL — LOSARTAN-HCTZ 100-25 MG TAB: 100-25 | 30 days supply | Qty: 30 | Fill #1

## 2019-09-01 ENCOUNTER — Other Ambulatory Visit: Payer: Self-pay | Admitting: *Deleted

## 2019-09-01 NOTE — Patient Outreach (Signed)
[facility]) Care Management  09/01/2019  Ronald Woodward 1961-03-15 [id number]   Monthly telephonic UMR case management review with [facility] RNCMs and Dr Alonza Bogus. Update provided to include:   Discussed transition of care call on 08/01/19 after hospital admission at [facility] 10/31-11/6/20 for  Acute respiratory failure with hypoxia, Covid 19 pneumonia and Acute kidney injury.  Discussed chronic condition of diabetes and introduced [place] chronic disease management program with [employer] , patient  interested and provided contact number, he declined additional education material on Diabetes management , use of blood sugar meter.  No other care management needs identified and case closed.    Joylene Draft, RN, [place] Management Coordinator  [phone]- Mobile [phone]- Toll Free Main Office

## 2019-09-13 MED FILL — AMOXICILLIN 875 MG TABS: 875 | 7 days supply | Qty: 14 | Fill #0

## 2019-09-27 DIAGNOSIS — E119 Type 2 diabetes mellitus without complications: Secondary | ICD-10-CM | POA: Diagnosis not present

## 2019-09-27 DIAGNOSIS — E785 Hyperlipidemia, unspecified: Secondary | ICD-10-CM | POA: Diagnosis not present

## 2019-09-27 DIAGNOSIS — F329 Major depressive disorder, single episode, unspecified: Secondary | ICD-10-CM | POA: Diagnosis not present

## 2019-09-27 DIAGNOSIS — I10 Essential (primary) hypertension: Secondary | ICD-10-CM | POA: Diagnosis not present

## 2019-10-01 MED FILL — LOSARTAN-HCTZ 100-25 MG TAB: 100-25 | 30 days supply | Qty: 30 | Fill #2

## 2019-10-04 DIAGNOSIS — I10 Essential (primary) hypertension: Secondary | ICD-10-CM | POA: Diagnosis not present

## 2019-10-04 DIAGNOSIS — E119 Type 2 diabetes mellitus without complications: Secondary | ICD-10-CM | POA: Diagnosis not present

## 2019-10-04 DIAGNOSIS — G4730 Sleep apnea, unspecified: Secondary | ICD-10-CM | POA: Diagnosis not present

## 2019-10-04 DIAGNOSIS — E785 Hyperlipidemia, unspecified: Secondary | ICD-10-CM | POA: Diagnosis not present

## 2019-10-05 MED FILL — AMLODIPINE BESYLATE 5 MG TA: 5 | 90 days supply | Qty: 90 | Fill #0

## 2019-11-11 MED FILL — METOPROLOL TARTRATE 50 MG T: 50 | 90 days supply | Qty: 90 | Fill #0

## 2019-11-11 MED FILL — LOSARTAN-HCTZ 100-25 MG TAB: 100-25 | 90 days supply | Qty: 90 | Fill #0

## 2019-11-11 MED FILL — SILDENAFIL CITRATE 20 MG TA: 20 | 10 days supply | Qty: 50 | Fill #1

## 2019-11-11 MED FILL — FREESTYLE LANCETS: 90 days supply | Qty: 100 | Fill #0

## 2019-11-11 MED FILL — FREESTYLE LITE TEST STRIP: 50 days supply | Qty: 50 | Fill #0

## 2019-11-11 MED FILL — metFORMIN HCL 1000 MG TABS: 1000 | 90 days supply | Qty: 180 | Fill #1

## 2019-11-11 MED FILL — XARELTO 10 MG TABLET: 10 | 90 days supply | Qty: 90 | Fill #0

## 2019-11-11 MED FILL — ATORVASTATIN 80 MG TABLET: 80 | 90 days supply | Qty: 90 | Fill #0

## 2019-11-21 DIAGNOSIS — M1990 Unspecified osteoarthritis, unspecified site: Secondary | ICD-10-CM | POA: Diagnosis not present

## 2019-11-21 DIAGNOSIS — I10 Essential (primary) hypertension: Secondary | ICD-10-CM | POA: Diagnosis not present

## 2019-11-21 DIAGNOSIS — E785 Hyperlipidemia, unspecified: Secondary | ICD-10-CM | POA: Diagnosis not present

## 2019-11-21 DIAGNOSIS — E119 Type 2 diabetes mellitus without complications: Secondary | ICD-10-CM | POA: Diagnosis not present

## 2019-12-08 DIAGNOSIS — Z125 Encounter for screening for malignant neoplasm of prostate: Secondary | ICD-10-CM | POA: Diagnosis not present

## 2019-12-08 DIAGNOSIS — Z1159 Encounter for screening for other viral diseases: Secondary | ICD-10-CM | POA: Diagnosis not present

## 2019-12-08 DIAGNOSIS — Z0189 Encounter for other specified special examinations: Secondary | ICD-10-CM | POA: Diagnosis not present

## 2019-12-08 DIAGNOSIS — E119 Type 2 diabetes mellitus without complications: Secondary | ICD-10-CM | POA: Diagnosis not present

## 2019-12-08 DIAGNOSIS — E785 Hyperlipidemia, unspecified: Secondary | ICD-10-CM | POA: Diagnosis not present

## 2019-12-08 DIAGNOSIS — Z7984 Long term (current) use of oral hypoglycemic drugs: Secondary | ICD-10-CM | POA: Diagnosis not present

## 2019-12-09 DIAGNOSIS — E785 Hyperlipidemia, unspecified: Secondary | ICD-10-CM | POA: Diagnosis not present

## 2019-12-09 DIAGNOSIS — E669 Obesity, unspecified: Secondary | ICD-10-CM | POA: Diagnosis not present

## 2019-12-09 DIAGNOSIS — I10 Essential (primary) hypertension: Secondary | ICD-10-CM | POA: Diagnosis not present

## 2019-12-09 DIAGNOSIS — E119 Type 2 diabetes mellitus without complications: Secondary | ICD-10-CM | POA: Diagnosis not present

## 2019-12-13 DIAGNOSIS — G4733 Obstructive sleep apnea (adult) (pediatric): Secondary | ICD-10-CM | POA: Diagnosis not present

## 2019-12-22 DIAGNOSIS — G4730 Sleep apnea, unspecified: Secondary | ICD-10-CM | POA: Diagnosis not present

## 2019-12-23 DIAGNOSIS — F331 Major depressive disorder, recurrent, moderate: Secondary | ICD-10-CM | POA: Diagnosis not present

## 2019-12-23 DIAGNOSIS — F419 Anxiety disorder, unspecified: Secondary | ICD-10-CM | POA: Diagnosis not present

## 2019-12-23 DIAGNOSIS — F1010 Alcohol abuse, uncomplicated: Secondary | ICD-10-CM | POA: Diagnosis not present

## 2019-12-29 DIAGNOSIS — F1010 Alcohol abuse, uncomplicated: Secondary | ICD-10-CM | POA: Diagnosis not present

## 2020-01-06 DIAGNOSIS — Z6841 Body Mass Index (BMI) 40.0 and over, adult: Secondary | ICD-10-CM | POA: Diagnosis not present

## 2020-01-06 DIAGNOSIS — F1010 Alcohol abuse, uncomplicated: Secondary | ICD-10-CM | POA: Diagnosis not present

## 2020-01-06 DIAGNOSIS — E669 Obesity, unspecified: Secondary | ICD-10-CM | POA: Diagnosis not present

## 2020-01-09 DIAGNOSIS — F339 Major depressive disorder, recurrent, unspecified: Secondary | ICD-10-CM | POA: Diagnosis not present

## 2020-01-09 DIAGNOSIS — F1010 Alcohol abuse, uncomplicated: Secondary | ICD-10-CM | POA: Diagnosis not present

## 2020-01-09 DIAGNOSIS — F419 Anxiety disorder, unspecified: Secondary | ICD-10-CM | POA: Diagnosis not present

## 2020-01-09 DIAGNOSIS — Z1211 Encounter for screening for malignant neoplasm of colon: Secondary | ICD-10-CM | POA: Diagnosis not present

## 2020-01-13 DIAGNOSIS — F1010 Alcohol abuse, uncomplicated: Secondary | ICD-10-CM | POA: Diagnosis not present

## 2020-01-13 DIAGNOSIS — F331 Major depressive disorder, recurrent, moderate: Secondary | ICD-10-CM | POA: Diagnosis not present

## 2020-02-17 DIAGNOSIS — Z7901 Long term (current) use of anticoagulants: Secondary | ICD-10-CM | POA: Diagnosis not present

## 2020-02-17 DIAGNOSIS — Z0000 Encounter for general adult medical examination without abnormal findings: Secondary | ICD-10-CM | POA: Diagnosis not present

## 2020-02-17 DIAGNOSIS — Z7984 Long term (current) use of oral hypoglycemic drugs: Secondary | ICD-10-CM | POA: Diagnosis not present

## 2020-02-17 DIAGNOSIS — I10 Essential (primary) hypertension: Secondary | ICD-10-CM | POA: Diagnosis not present

## 2020-02-17 DIAGNOSIS — E119 Type 2 diabetes mellitus without complications: Secondary | ICD-10-CM | POA: Diagnosis not present

## 2020-02-17 DIAGNOSIS — I82402 Acute embolism and thrombosis of unspecified deep veins of left lower extremity: Secondary | ICD-10-CM | POA: Diagnosis not present

## 2020-02-17 DIAGNOSIS — Z125 Encounter for screening for malignant neoplasm of prostate: Secondary | ICD-10-CM | POA: Diagnosis not present

## 2020-02-17 DIAGNOSIS — Z5181 Encounter for therapeutic drug level monitoring: Secondary | ICD-10-CM | POA: Diagnosis not present

## 2020-02-17 DIAGNOSIS — Z86711 Personal history of pulmonary embolism: Secondary | ICD-10-CM | POA: Diagnosis not present

## 2020-02-18 ENCOUNTER — Other Ambulatory Visit: Payer: Self-pay

## 2020-02-18 ENCOUNTER — Encounter (HOSPITAL_COMMUNITY): Payer: Self-pay | Admitting: Emergency Medicine

## 2020-02-18 ENCOUNTER — Emergency Department (HOSPITAL_COMMUNITY)
Admission: EM | Admit: 2020-02-18 | Discharge: 2020-02-18 | Disposition: A | Discharge status: Home / Self Care | Payer: 59 | Attending: Emergency Medicine | Admitting: Emergency Medicine

## 2020-02-18 ENCOUNTER — Emergency Department (HOSPITAL_COMMUNITY): Payer: 59

## 2020-02-18 DIAGNOSIS — Z79899 Other long term (current) drug therapy: Secondary | ICD-10-CM | POA: Insufficient documentation

## 2020-02-18 DIAGNOSIS — D3502 Benign neoplasm of left adrenal gland: Secondary | ICD-10-CM | POA: Insufficient documentation

## 2020-02-18 DIAGNOSIS — E119 Type 2 diabetes mellitus without complications: Secondary | ICD-10-CM | POA: Insufficient documentation

## 2020-02-18 DIAGNOSIS — K5732 Diverticulitis of large intestine without perforation or abscess without bleeding: Secondary | ICD-10-CM | POA: Diagnosis not present

## 2020-02-18 DIAGNOSIS — Z7901 Long term (current) use of anticoagulants: Secondary | ICD-10-CM | POA: Insufficient documentation

## 2020-02-18 DIAGNOSIS — R109 Unspecified abdominal pain: Secondary | ICD-10-CM | POA: Diagnosis not present

## 2020-02-18 DIAGNOSIS — K5792 Diverticulitis of intestine, part unspecified, without perforation or abscess without bleeding: Secondary | ICD-10-CM | POA: Diagnosis not present

## 2020-02-18 DIAGNOSIS — R160 Hepatomegaly, not elsewhere classified: Secondary | ICD-10-CM | POA: Insufficient documentation

## 2020-02-18 DIAGNOSIS — R1084 Generalized abdominal pain: Secondary | ICD-10-CM | POA: Diagnosis present

## 2020-02-18 DIAGNOSIS — Z7984 Long term (current) use of oral hypoglycemic drugs: Secondary | ICD-10-CM | POA: Insufficient documentation

## 2020-02-18 DIAGNOSIS — Z86718 Personal history of other venous thrombosis and embolism: Secondary | ICD-10-CM | POA: Diagnosis not present

## 2020-02-18 LAB — URINALYSIS, ROUTINE W REFLEX MICROSCOPIC
Bilirubin Urine: NEGATIVE
Glucose, UA: NEGATIVE mg/dL
Hgb urine dipstick: NEGATIVE
Ketones, ur: NEGATIVE mg/dL
Leukocytes,Ua: NEGATIVE
Nitrite: NEGATIVE
Protein, ur: NEGATIVE mg/dL
Specific Gravity, Urine: 1.016 (ref 1.005–1.030)
pH: 6 (ref 5.0–8.0)

## 2020-02-18 LAB — COMPREHENSIVE METABOLIC PANEL
ALT: 20 U/L (ref 0–44)
AST: 17 U/L (ref 15–41)
Albumin: 3.4 g/dL — ABNORMAL LOW (ref 3.5–5.0)
Alkaline Phosphatase: 77 U/L (ref 38–126)
Anion gap: 11 (ref 5–15)
BUN: 11 mg/dL (ref 6–20)
CO2: 25 mmol/L (ref 22–32)
Calcium: 8.7 mg/dL — ABNORMAL LOW (ref 8.9–10.3)
Chloride: 104 mmol/L (ref 98–111)
Creatinine, Ser: 0.95 mg/dL (ref 0.61–1.24)
GFR calc Af Amer: >60 mL/min (ref >60)
GFR calc non Af Amer: >60 mL/min (ref >60)
Glucose, Bld: 119 mg/dL — ABNORMAL HIGH (ref 70–99)
Potassium: 3 mmol/L — ABNORMAL LOW (ref 3.5–5.1)
Sodium: 140 mmol/L (ref 135–145)
Total Bilirubin: 0.7 mg/dL (ref 0.3–1.2)
Total Protein: 7.4 g/dL (ref 6.5–8.1)

## 2020-02-18 LAB — CBC
HCT: 38.3 % — ABNORMAL LOW (ref 39.0–52.0)
Hemoglobin: 12.4 g/dL — ABNORMAL LOW (ref 13.0–17.0)
MCH: 27.6 pg (ref 26.0–34.0)
MCHC: 32.4 g/dL (ref 30.0–36.0)
MCV: 85.3 fL (ref 80.0–100.0)
Platelets: 273 10*3/uL (ref 150–400)
RBC: 4.49 MIL/uL (ref 4.22–5.81)
RDW: 14.5 % (ref 11.5–15.5)
WBC: 12.2 10*3/uL — ABNORMAL HIGH (ref 4.0–10.5)
nRBC: 0 % (ref 0.0–0.2)

## 2020-02-18 LAB — LIPASE, BLOOD: Lipase: 32 U/L (ref 11–51)

## 2020-02-18 MED ORDER — ONDANSETRON 4 MG PO TBDP: 4.0000 mg | ORAL_TABLET | Freq: Three times a day (TID) | ORAL | 0 refills | Status: AC | PRN

## 2020-02-18 MED ORDER — IOHEXOL 300 MG/ML  SOLN
100.0000 mL | Freq: Once | INTRAMUSCULAR | Status: AC | PRN
  Administered 2020-02-18: 100 mL via INTRAVENOUS

## 2020-02-18 MED ORDER — DICYCLOMINE HCL 10 MG PO CAPS
20.0000 mg | ORAL_CAPSULE | Freq: Once | ORAL | Status: AC
  Administered 2020-02-18: 20 mg via ORAL
  Filled 2020-02-18: qty 2

## 2020-02-18 MED ORDER — POTASSIUM CHLORIDE 10 MEQ/100ML IV SOLN
10.0000 meq | Freq: Once | INTRAVENOUS | Status: AC
  Administered 2020-02-18: 10 meq via INTRAVENOUS
  Filled 2020-02-18: qty 100

## 2020-02-18 MED ORDER — MORPHINE SULFATE (PF) 4 MG/ML IV SOLN
4.0000 mg | Freq: Once | INTRAVENOUS | Status: AC
  Administered 2020-02-18: 4 mg via INTRAVENOUS
  Filled 2020-02-18: qty 1

## 2020-02-18 MED ORDER — POTASSIUM CHLORIDE CRYS ER 20 MEQ PO TBCR
40.0000 meq | EXTENDED_RELEASE_TABLET | Freq: Once | ORAL | Status: AC
  Administered 2020-02-18: 40 meq via ORAL
  Filled 2020-02-18: qty 2

## 2020-02-18 MED ORDER — SODIUM CHLORIDE 0.9% FLUSH
3.0000 mL | Freq: Once | INTRAVENOUS | Status: AC
  Administered 2020-02-18: 3 mL via INTRAVENOUS

## 2020-02-18 MED ORDER — AMOXICILLIN-POT CLAVULANATE 875-125 MG PO TABS: 1.0000 | ORAL_TABLET | Freq: Three times a day (TID) | ORAL | 0 refills | Status: AC

## 2020-02-18 MED ORDER — SODIUM CHLORIDE 0.9 % IV BOLUS
1000.0000 mL | Freq: Once | INTRAVENOUS | Status: AC
  Administered 2020-02-18: 1000 mL via INTRAVENOUS

## 2020-02-18 NOTE — ED Notes (Signed)
Pt aware of need for urine sample, urinal at bedside. Call button within reach

## 2020-02-18 NOTE — ED Notes (Signed)
Patient verbalizes understanding of discharge instructions. Opportunity for questioning and answers were provided. Armband removed by staff, pt discharged from ED to home. Ambulatory, a&ox4 at DC

## 2020-02-18 NOTE — Discharge Instructions (Addendum)
As discussed, your CT scan showed diverticulitis today.  There were also several incidental findings.  First a liver lobe mass was found which should be further evaluated by gastroenterologist.  I have provided you phone number for 1.  Please call to make an appointment in future.  There is also a adrenal mass which was noted in the CT scan to be likely benign however this should be further evaluated as well.  I have prescribed you Augmentin which you should take as prescribed for diverticulitis.  I have also provided you with a prescription for Zofran which is a nausea medication.  Please drink plenty of water.  Please eat a bland food diet.  Please read the attached information.

## 2020-02-18 NOTE — ED Notes (Signed)
Pt returns from ct scan. 

## 2020-02-18 NOTE — ED Triage Notes (Signed)
C/o left lower abd pain x 4 days that is gradually getting worse.  Denies nausea, vomiting, diarrhea, and urinary complaints.

## 2020-02-18 NOTE — ED Provider Notes (Signed)
[name]   CSN: [id number] Arrival date & time: 02/18/20  0935     History Chief Complaint  Ronald Woodward presents with  . Abdominal Pain    Ronald Woodward Woodward a 59 y.o. male.  HPI  Ronald Woodward a 59 year old gentleman with a past medical history significant for DM, DVT on anticoagulant, HTN, HLD, obesity, sleep apnea on CPAP  Ronald Woodward a 59 year old gentleman with past medical history detailed below presented today for 4 days of gradually worsening left lower abdominal pain that Woodward achy, occasionally sharp and stabbing without radiation.  He denies any history of abdominal surgeries.  He denies any nausea, vomiting, diarrhea or dysuria, frequency urgency or other urinary symptoms.  He denies any history of kidney stones.  He denies any lightheadedness dizziness, fevers, chills or lightheadedness or dizziness.  He denies any history of diverticulitis however states that he has never had a CT scan of his abdomen done before.  He has a history of low potassium and that was on supplements in the past however states he has been off these for some time.     Past Medical History:  Diagnosis Date  . Diabetes ([place])   . DVT (deep venous thrombosis) ([place])   . HTN (hypertension)   . Hyperlipidemia   . Obesity   . Palpitation   . PE (physical exam), annual   . Sleep apnea    CPAP    Ronald Woodward Active Problem List   Diagnosis Date Noted  . Acute respiratory failure with hypoxia ([place]) 07/23/2019  . Diabetes ([place]) 07/23/2019  . OSA on CPAP 07/23/2019  . AKI (acute kidney injury) ([place]) 07/23/2019  . Hypokalemia 07/23/2019  . Essential hypertension 07/30/2017  . Morbid obesity ([place]) 04/29/2017  . SOB (shortness of breath) 04/29/2017  . Palpitation 07/07/2014  . DVT (deep venous thrombosis) ([place]) 07/07/2014    Past Surgical History:  Procedure Laterality Date  . Athroscopic     Knee (right)  . TONSILLECTOMY AND ADENOIDECTOMY          Family History  Problem Relation Age of Onset  . Clotting disorder Father   . Hypertension Mother     Social History   Tobacco Use  . Smoking status: Former Smoker    Packs/day: 0.50    Years: 10.00    Pack years: 5.00    Types: Cigarettes  . Smokeless tobacco: Former Systems developer    Quit date: 06/22/2013  Substance Use Topics  . Alcohol use: No  . Drug use: No    Home Medications Prior to Admission medications   Medication Sig Start Date End Date Taking? Authorizing Provider  acetaminophen (TYLENOL) 500 MG tablet Take 500-1,000 mg by mouth every 6 (six) hours as needed for headache (pain).    [provider]  amoxicillin-clavulanate (AUGMENTIN) 875-125 MG tablet Take 1 tablet by mouth every 8 (eight) hours for 10 days. 02/18/20 02/28/20  Tedd Sias, PA  atorvastatin (LIPITOR) 80 MG tablet Take 80 mg by mouth daily. 06/13/19   [provider]  bismuth subsalicylate (PEPTO BISMOL) 262 MG/15ML suspension Take 30 mLs by mouth every 6 (six) hours as needed for indigestion (upset stomach).    [provider]  blood glucose meter kit and supplies KIT Dispense based on Ronald Woodward and insurance preference. Use up to four times daily as directed. (FOR ICD-9 250.00, 250.01). 07/29/19   Elodia Florence., MD  Glucosamine HCl (GLUCOSAMINE PO) Take 1 tablet by  mouth daily.    [provider]  loratadine (CLARITIN) 10 MG tablet Take 10 mg by mouth daily.     [provider]  losartan-hydrochlorothiazide (HYZAAR) 100-25 MG tablet Take 1 tablet by mouth daily. 03/29/19   [provider]  metFORMIN (GLUCOPHAGE) 1000 MG tablet Take 1 tablet (1,000 mg total) by mouth 2 (two) times daily with a meal. 07/29/19 08/28/19  Elodia Florence., MD  metoprolol succinate (TOPROL-XL) 50 MG 24 hr tablet TAKE 1 TABLET (50 MG TOTAL) BY MOUTH DAILY. Ronald Woodward not taking: Reported on 07/23/2019 05/04/18   Minus Breeding, MD  metoprolol tartrate (LOPRESSOR) 50  MG tablet Take 50 mg by mouth daily. 05/10/19   [provider]  ondansetron (ZOFRAN ODT) 4 MG disintegrating tablet Take 1 tablet (4 mg total) by mouth every 8 (eight) hours as needed for nausea or vomiting. 02/18/20   Tedd Sias, PA  potassium chloride (KLOR-CON 10) 10 MEQ tablet Take 2 tablets (20 mEq total) by mouth daily for 14 days. 07/29/19 08/12/19  Elodia Florence., MD  Pseudoeph-Doxylamine-DM-APAP (NYQUIL PO) Take 30 mLs by mouth every 6 (six) hours as needed (cough/ congestion).    [provider]  Pseudoephedrine-APAP-DM (DAYQUIL PO) Take 30 mLs by mouth every 6 (six) hours as needed (cough/congestion).    [provider]  rivaroxaban (XARELTO) 10 MG TABS tablet Take 10 mg by mouth daily.    [provider]  sildenafil (REVATIO) 20 MG tablet Take 40 mg by mouth daily as needed (erectile dysfunction).  03/09/19   [provider]    Allergies    Ronald Woodward has no known allergies.  Review of Systems   Review of Systems  Constitutional: Negative for chills and fever.  HENT: Negative for congestion.   Eyes: Negative for pain.  Respiratory: Negative for cough and shortness of breath.   Cardiovascular: Negative for chest pain and leg swelling.  Gastrointestinal: Positive for abdominal pain. Negative for diarrhea, nausea and vomiting.  Genitourinary: Negative for dysuria.  Musculoskeletal: Negative for myalgias.  Skin: Negative for rash.  Neurological: Negative for dizziness and headaches.    Physical Exam Updated Vital Signs BP 122/73 (BP Location: Right Arm)   Pulse 66   Temp 98.7 F (37.1 C) (Oral)   Resp 20   SpO2 97%   Physical Exam Vitals and nursing note reviewed.  Constitutional:      General: He Woodward not in acute distress. HENT:     Head: Normocephalic and atraumatic.     Nose: Nose normal.     Mouth/Throat:     Mouth: Mucous membranes are moist.  Eyes:     General: No scleral icterus. Cardiovascular:     Rate  and Rhythm: Normal rate and regular rhythm.     Pulses: Normal pulses.     Heart sounds: Normal heart sounds.  Pulmonary:     Effort: Pulmonary effort Woodward normal. No respiratory distress.     Breath sounds: No wheezing.  Abdominal:     Palpations: Abdomen Woodward soft.     Tenderness: There Woodward abdominal tenderness. There Woodward no right CVA tenderness, left CVA tenderness or guarding.     Comments: Obese abdomen with tenderness in left lower quadrant with voluntary guarding with deep palpation.  No CVA tenderness, no involuntary guarding or rebound.  No hernias or masses palpated.  Musculoskeletal:     Cervical back: Normal range of motion.     Right lower leg: No edema.  Left lower leg: No edema.  Skin:    General: Skin Woodward warm and dry.     Capillary Refill: Capillary refill takes less than 2 seconds.  Neurological:     Mental Status: He Woodward alert. Mental status Woodward at baseline.  Psychiatric:        Mood and Affect: Mood normal.        Behavior: Behavior normal.     ED Results / Procedures / Treatments   Labs (all labs ordered are listed, but only abnormal results are displayed) Labs Reviewed  COMPREHENSIVE METABOLIC PANEL - Abnormal; Notable for the following components:      Result Value   Potassium 3.0 (*)    Glucose, Bld 119 (*)    Calcium 8.7 (*)    Albumin 3.4 (*)    All other components within normal limits  CBC - Abnormal; Notable for the following components:   WBC 12.2 (*)    Hemoglobin 12.4 (*)    HCT 38.3 (*)    All other components within normal limits  LIPASE, BLOOD  URINALYSIS, ROUTINE W REFLEX MICROSCOPIC    EKG None  Radiology CT ABDOMEN PELVIS W CONTRAST  Result Date: 02/18/2020 CLINICAL DATA:  Lower abdominal pain, primarily left-sided EXAM: CT ABDOMEN AND PELVIS WITH CONTRAST TECHNIQUE: Multidetector CT imaging of the abdomen and pelvis was performed using the standard protocol following bolus administration of intravenous contrast. CONTRAST:  144m  OMNIPAQUE IOHEXOL 300 MG/ML  SOLN COMPARISON:  None. FINDINGS: Lower chest: There Woodward bibasilar atelectasis. Hepatobiliary: There Woodward a mass arising in the posterior segment of the right lobe of the liver measuring 2.5 x 2.8 cm which Woodward noncystic. No other liver lesions are appreciable on this noncontrast enhanced study. Gallbladder wall Woodward not appreciably thickened. There Woodward no biliary duct dilatation. Pancreas: There Woodward no pancreatic mass or inflammatory focus. Spleen: No splenic lesions are evident. Adrenals/Urinary Tract: Right adrenal appears normal. There Woodward a 6 mm adenoma in the superior aspect of the left adrenal. Left adrenal otherwise appears normal. There Woodward a cyst arising in the upper pole of the left kidney measuring 2.2 x 2.0 cm. There Woodward a cyst in the posterior mid left kidney measuring 6 x 6 mm. There Woodward a cyst in the posterior right kidney measuring 1.1 x 0.8 cm. No evident hydronephrosis on either side. There Woodward no renal or ureteral calculus on either side. Urinary bladder Woodward midline with wall thickness within normal limits. Stomach/Bowel: There Woodward wall thickening in the distal descending colon with several diverticula in this area which appear slightly irregular. There Woodward soft tissue stranding and fluid in this area with fluid tracking into the lateral conal fascia on the left. No perforation or abscess seen in this area of apparent diverticulitis. Elsewhere there Woodward no bowel wall thickening or evidence of bowel obstruction. The terminal ileum appears normal. There Woodward no free air or portal venous air. Vascular/Lymphatic: No abdominal aortic aneurysm. There are a few vascular calcifications in the aorta. Major venous structures appear patent. There Woodward no evident adenopathy in the abdomen or pelvis. Reproductive: Prostate and seminal vesicles are normal in size and contour. No evident pelvic mass. Other: The appendix appears normal. No evident abscess or ascites in the abdomen or pelvis. There Woodward fat  in each inguinal ring, more on the right than on the left. There Woodward slight fat in the umbilicus. Musculoskeletal: There Woodward degenerative change in the lumbar spine. No blastic or lytic bone lesions. There Woodward  no intramuscular lesion. IMPRESSION: 1. Apparent diverticulitis involving the mid to distal descending colon with localized fluid and soft tissue stranding. There Woodward wall thickening in this area. No abscess or perforation evident. 2. No bowel obstruction. No abscess in the abdomen or pelvis. Appendix appears normal. 3.  Subcentimeter benign adenoma left adrenal. 4. No renal or ureteral calculi. No hydronephrosis. Urinary bladder wall thickness normal. 5. Noncystic right lobe liver mass measuring 2.5 x 2.8 cm. Further evaluation of this lesion with pre and serial post-contrast MR or CT nonemergently may well be advised to further assess. 6.  Aortic Atherosclerosis (ICD10-I70.0). Electronically Signed   By: Lowella Grip III M.D.   On: 02/18/2020 15:58    Procedures Procedures (including critical care time)  Medications Ordered in ED Medications  sodium chloride flush (NS) 0.9 % injection 3 mL (3 mLs Intravenous Given 02/18/20 1230)  morphine 4 MG/ML injection 4 mg (4 mg Intravenous Given 02/18/20 1224)  sodium chloride 0.9 % bolus 1,000 mL (0 mLs Intravenous Stopped 02/18/20 1354)  dicyclomine (BENTYL) capsule 20 mg (20 mg Oral Given 02/18/20 1220)  potassium chloride SA (KLOR-CON) CR tablet 40 mEq (40 mEq Oral Given 02/18/20 1220)  potassium chloride 10 mEq in 100 mL IVPB (0 mEq Intravenous Stopped 02/18/20 1353)  iohexol (OMNIPAQUE) 300 MG/ML solution 100 mL (100 mLs Intravenous Contrast Given 02/18/20 1544)    ED Course  I have reviewed the triage vital signs and the nursing notes.  Pertinent labs & imaging results that were available during my care of the Ronald Woodward were reviewed by me and considered in my medical decision making (see chart for details).  Ronald Woodward 59 year old gentleman with  gradually worsening left lower quadrant abdominal pain.  He does not have a history of diverticulitis however his history and physical exam Woodward extremely consistent with this.  Will obtain CT imaging as this Woodward his first occurrence of this.  He did endorse one episode of dark stool however he also states this was after he took Pepto-Bismol.  He denies any lightheadedness and his hemoglobin Woodward within normal limits medication for rectal exam/further evaluation of unlikely upper GI bleed at this time.  Suspect that Ronald Woodward has diverticulitis.  CT scan pending.  Clinical Course as of Feb 18 2011  Sat Feb 18, 2020  1536 CMP notable for potassium that Woodward low 3.0 this will be repleted with IV and oral potassium.  He has a history of low potassium and was on supplements during Covid however he stopped these long ago.  He denies any muscle cramps or aches.  Urinalysis without any normalities no hematuria low suspicion for kidney stone.   [WF]  1603 Leukocytosis 12.2.  No significant anemia.  Lipase within normal limits I doubt pancreatitis.   [WF]  1603 CT scan shows uncomplicated diverticulitis.  He does not have a history of this will discharge with Augmentin and Zofran.  He will follow up with his primary care doctor and gastroenterology.   [WF]  1604 I discussed all findings that were abnormal today including CT abdomen pelvis findings with incidental abnormalities including liver lobe mass and left adrenal adenoma.  Recommended follow-up.  For these.   [WF]    Clinical Course User Index [WF] Tedd Sias, [place]   Ronald Woodward reevaluated after imaging conducted.  He does appear to have diverticulitis there were several incidental findings on his CT and scans as well.  I discussed these with him.  After discussing the adrenal mass Ronald Woodward did  endorse occasional episodes of hypertension and anxiety with flushing.  I discussed with him the very rare condition of pheochromocytoma and discussed that follow-up with  primary care for further evaluation of this would be prudent.  I also provided Ronald Woodward with GI referral as he does have a significant liver mass.  Doubt that this Woodward related to his symptoms today.  Woodward mild at this time.  Will provide Ronald Woodward with Tylenol be proven recommendations and Augmentin for treatment of outpatient uncomplicated diverticulitis.  This Woodward his first episode he has no evidence of abscess or perforation.  He Woodward tolerating p.o. at this time has no nausea and states his pain Woodward significantly improved with morphine.  He will need to use analgesia at home.  Ronald Woodward also given Zofran discharge.  MDM Rules/Calculators/A&P                      Final Clinical Impression(s) / ED Diagnoses Final diagnoses:  Diverticulitis  Liver mass  Adenoma of left adrenal gland    Rx / DC Orders ED Discharge Orders         Ordered    ondansetron (ZOFRAN ODT) 4 MG disintegrating tablet  Every 8 hours PRN     02/18/20 1615    amoxicillin-clavulanate (AUGMENTIN) 875-125 MG tablet  Every 8 hours     02/18/20 [place], Reagan Klemz S, [place] 02/18/20 2012    Virgel Manifold, MD 02/19/20 [phone]

## 2020-02-18 NOTE — ED Notes (Signed)
Patient transported to CT 

## 2020-04-06 IMAGING — US US RENAL
1 series · 14 of 25 positions shown · non-contrast
Comparison: None.

CLINICAL DATA: Acute kidney insufficiency.

EXAM:
RENAL / URINARY TRACT ULTRASOUND COMPLETE

[Series 1: us renal · 42 acquisitions, 14 frames shown]
[im 1/42]
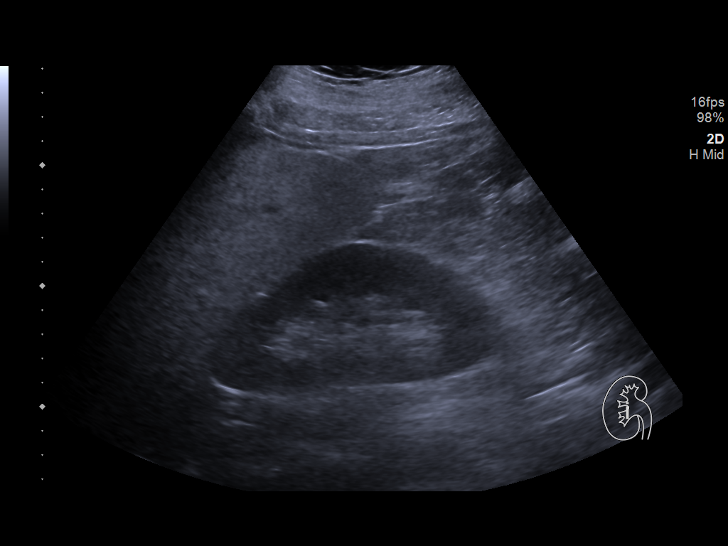
[im 4/42]
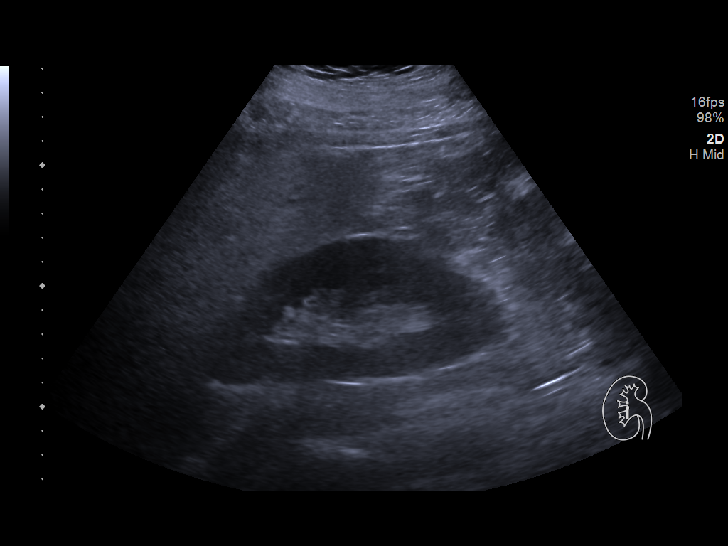
[im 7/42]
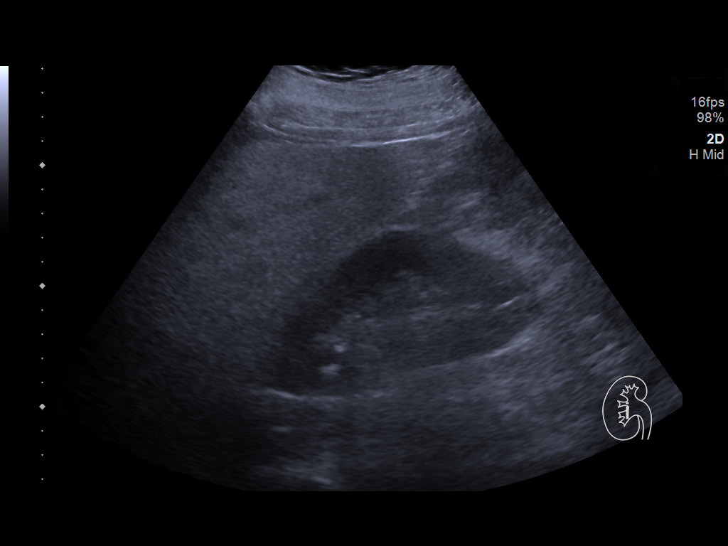
[im 11/42]
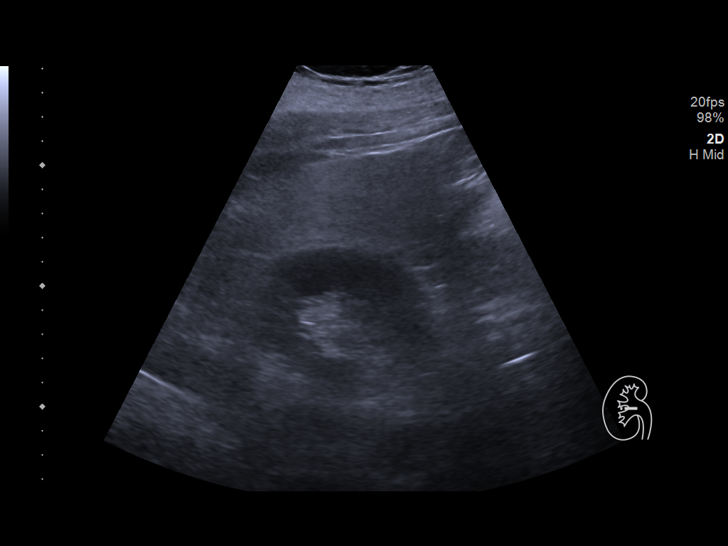
[im 14/42]
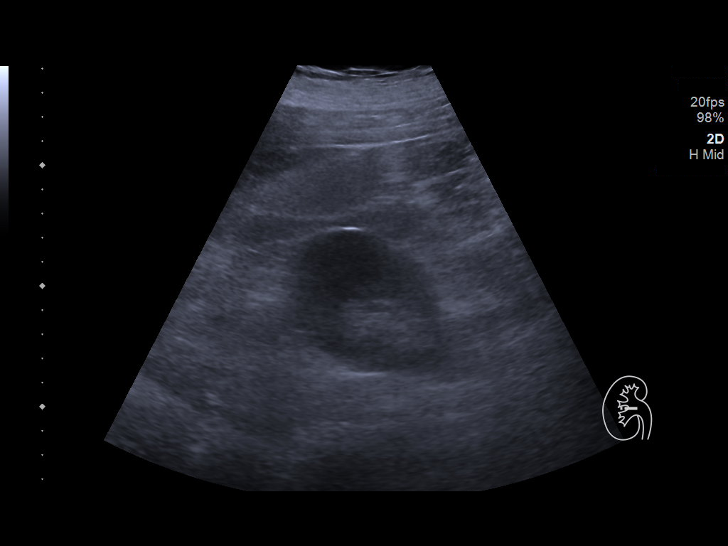
[im 16/42]
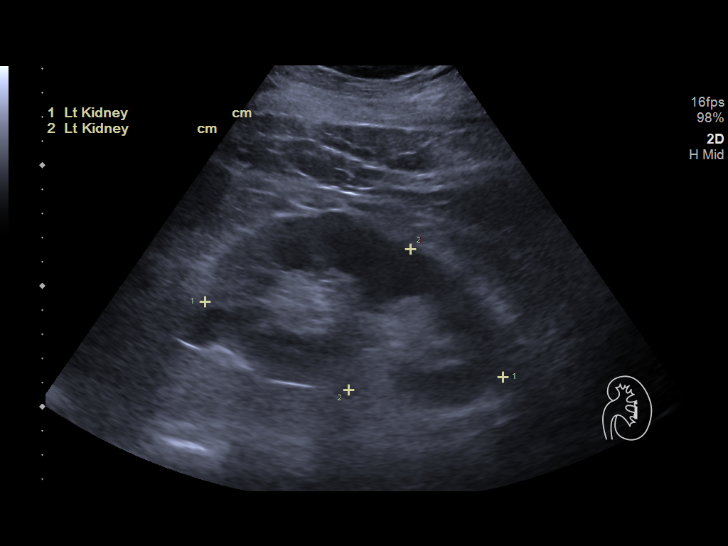
[im 19/42]
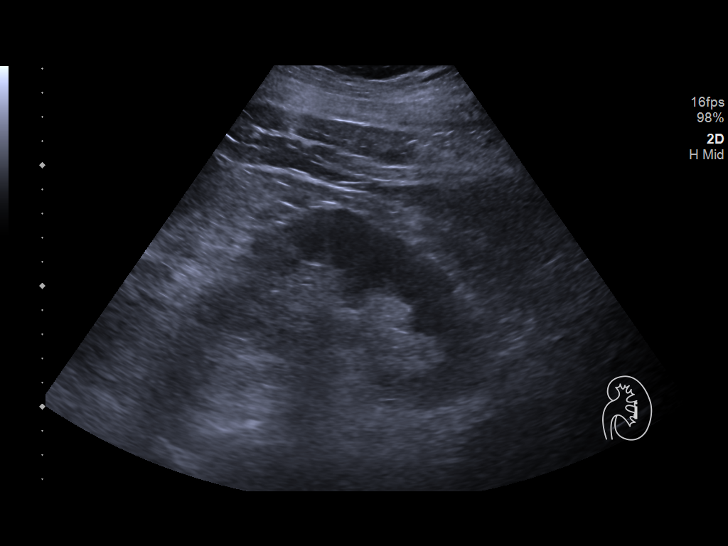
[im 23/42]
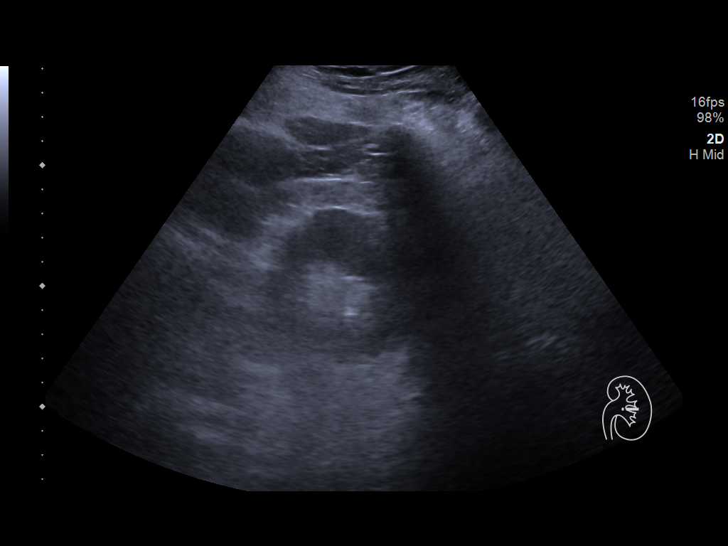
[im 26/42]
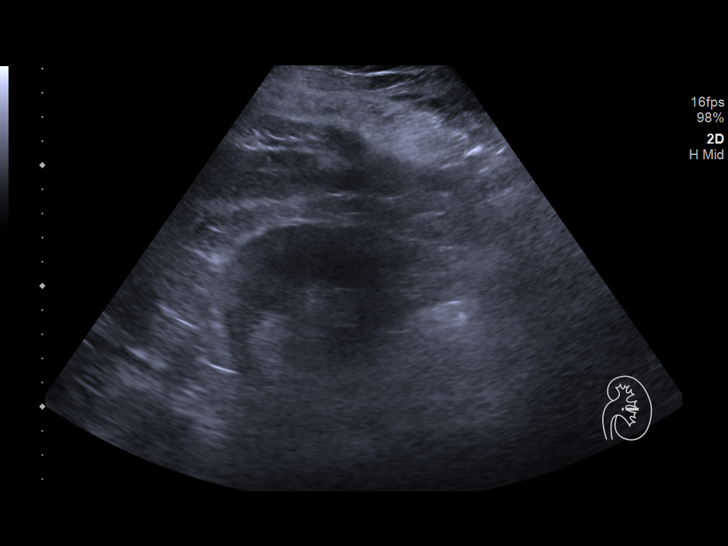
[im 28/42]
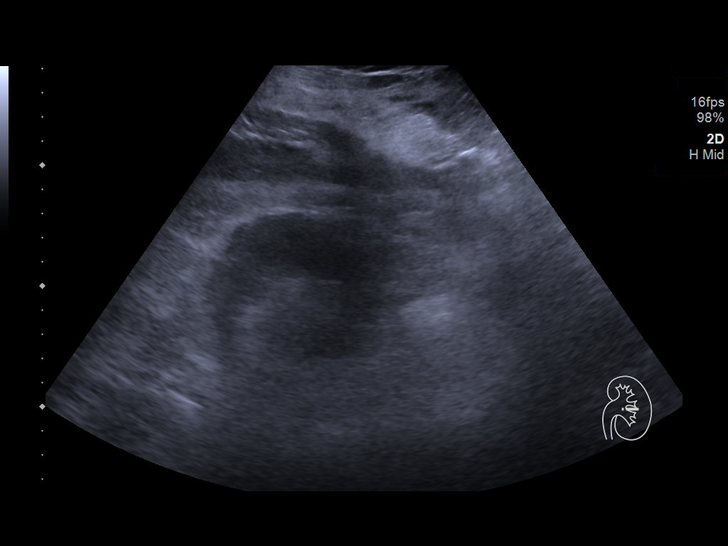
[im 31/42]
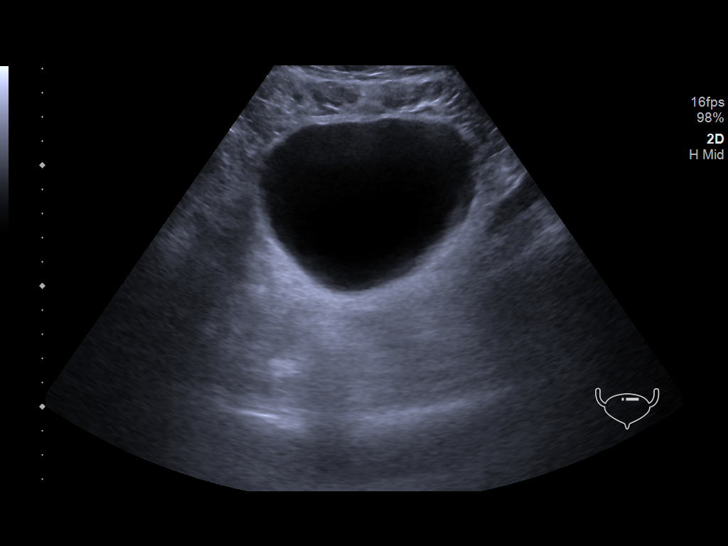
[im 35/42]
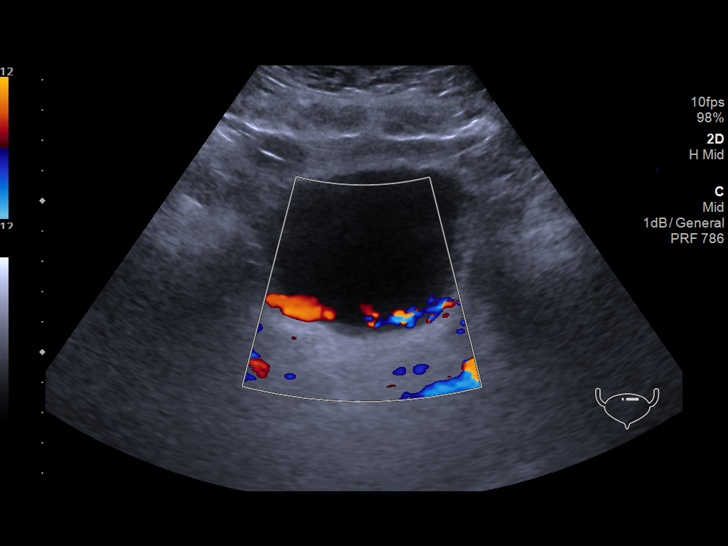
[im 38/42]
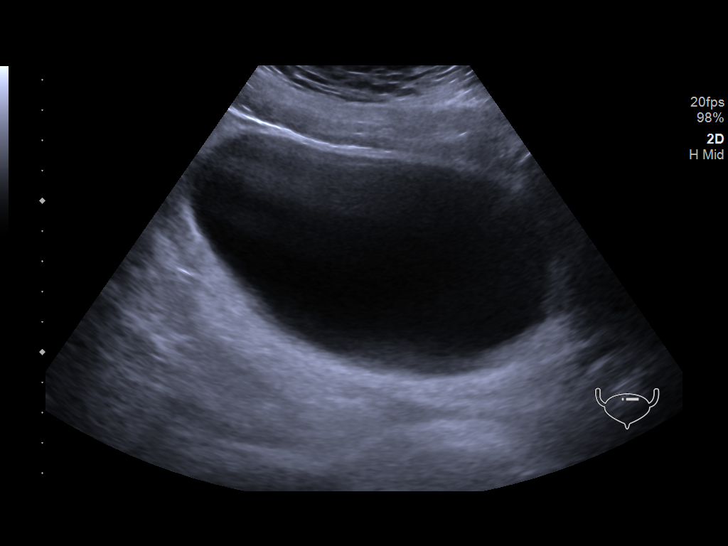
[im 42/42]
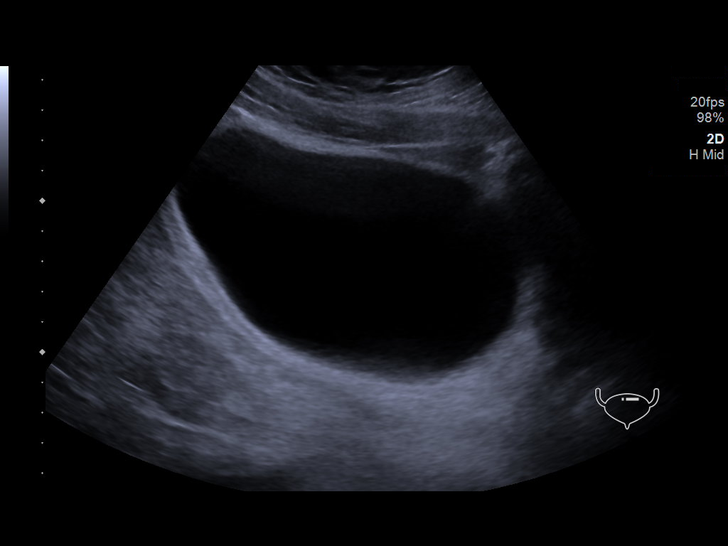

[14 of 25 positions shown; findings below may reference images not displayed]

FINDINGS: Right Kidney:

Renal measurements: 12.0 x 6.0 x 4.9 cm = volume: 183 mL .
Echogenicity within normal limits. No mass or hydronephrosis
visualized.

Left Kidney:

Renal measurements: 12.7 x 6.4 x 5.5 cm = volume: 231 mL.
Echogenicity within normal limits. No mass or hydronephrosis
visualized.

Bladder:

Appears normal for degree of bladder distention.
IMPRESSION: Normal bilateral renal ultrasound. No acute or focal lesion to
explain renal insufficiency.

## 2020-05-23 DIAGNOSIS — M170 Bilateral primary osteoarthritis of knee: Secondary | ICD-10-CM | POA: Diagnosis not present

## 2020-11-01 IMAGING — CT CT ABD-PELV W/ CM
2 of 5 series · 15 of 46 positions shown, 17 images · IV contrast (omnipaque)
Comparison: None.

CLINICAL DATA: Lower abdominal pain, primarily left-sided

EXAM:
CT ABDOMEN AND PELVIS WITH CONTRAST
TECHNIQUE: Multidetector CT imaging of the abdomen and pelvis was performed
using the standard protocol following bolus administration of
intravenous contrast.
CONTRAST:  100mL OMNIPAQUE IOHEXOL 300 MG/ML  SOLN

[Series 3: a/p w/ 5mm · axial · 0.98mm/px · z∈[-351,+159]mm · 12 of 116 slices shown, 14 images]
[im 7/116  soft-tissue]
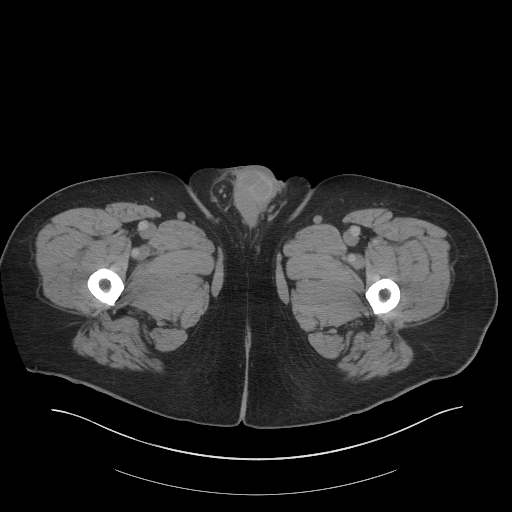
[im 7/116  bone]
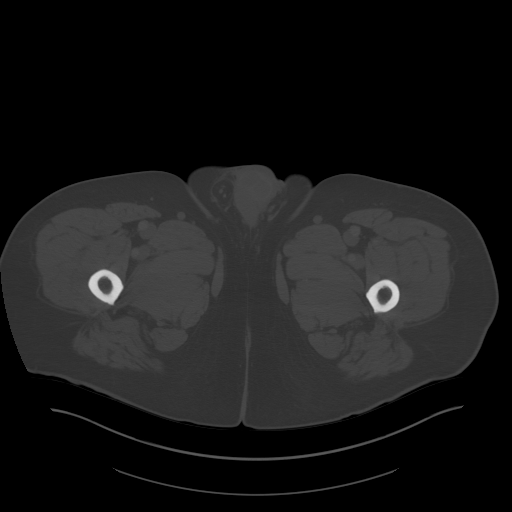
[im 20/116  soft-tissue]
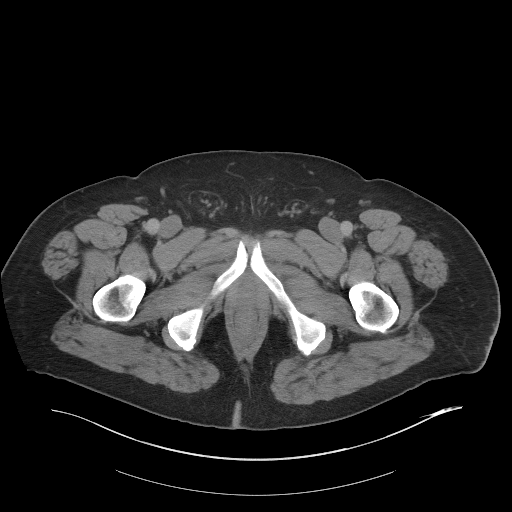
[im 26/116  soft-tissue]
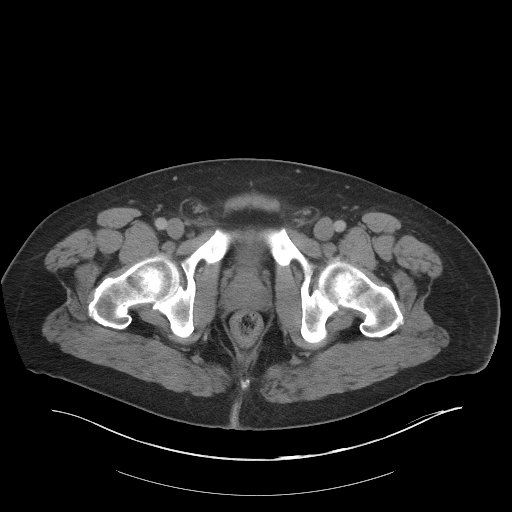
[im 32/116  soft-tissue]
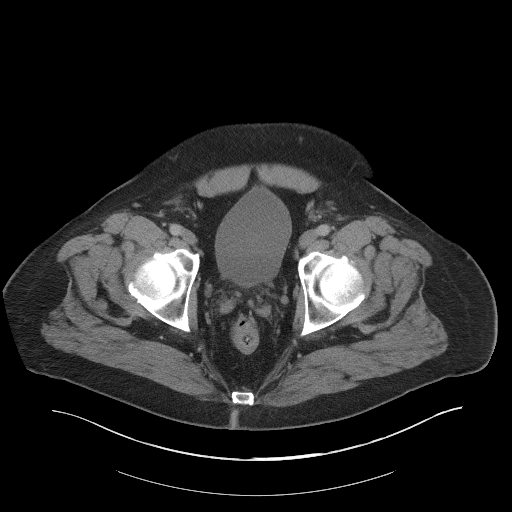
[im 45/116  soft-tissue]
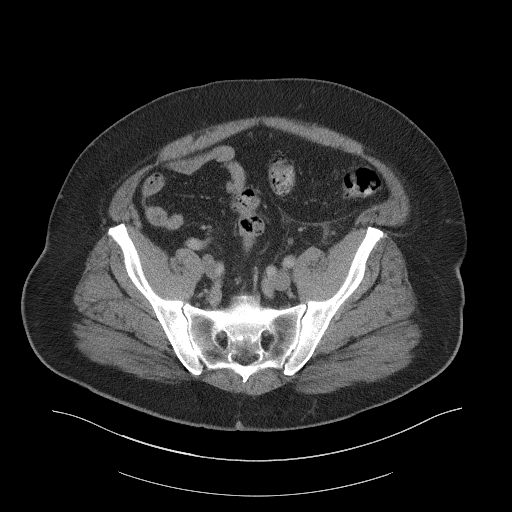
[im 52/116  soft-tissue]
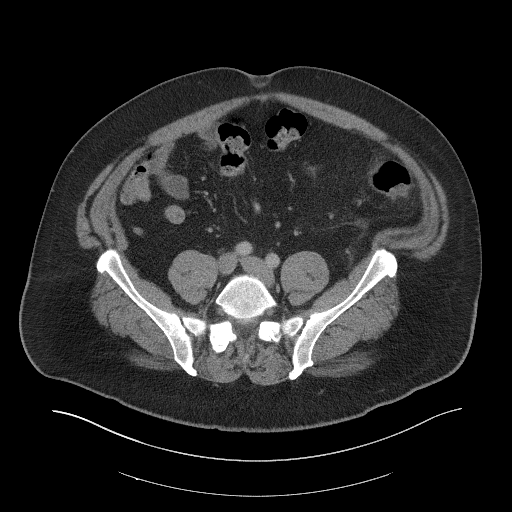
[im 64/116  soft-tissue]
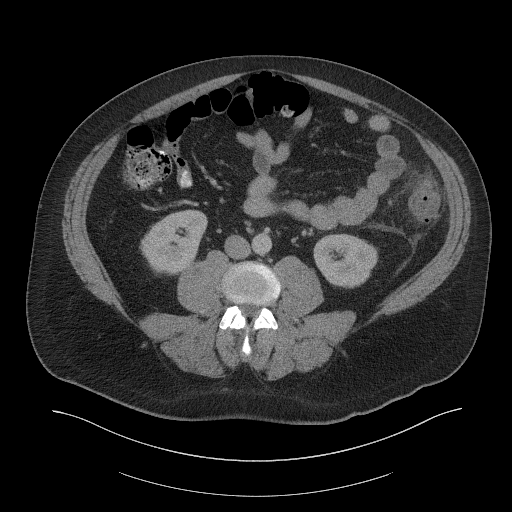
[im 71/116  soft-tissue]
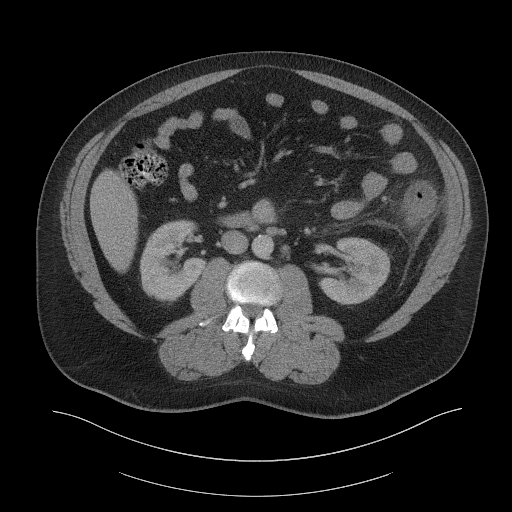
[im 84/116  soft-tissue]
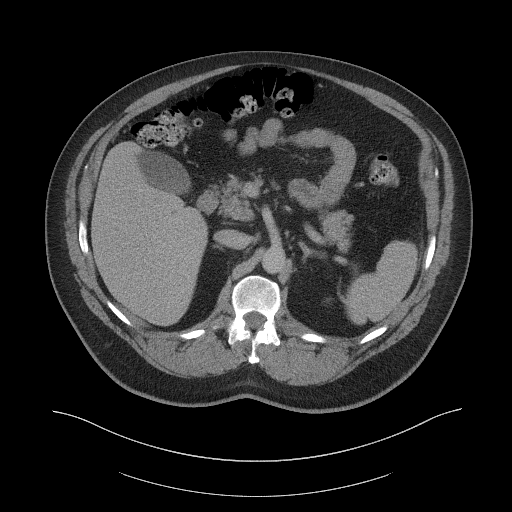
[im 84/116  bone]
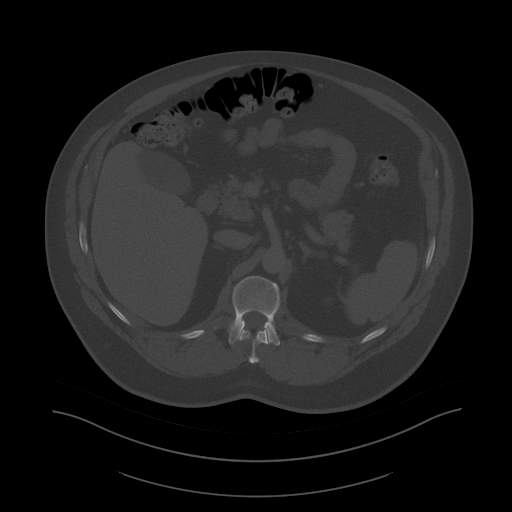
[im 90/116  soft-tissue]
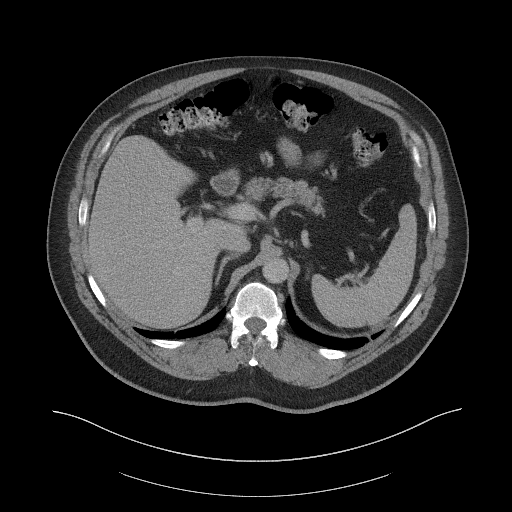
[im 96/116  soft-tissue]
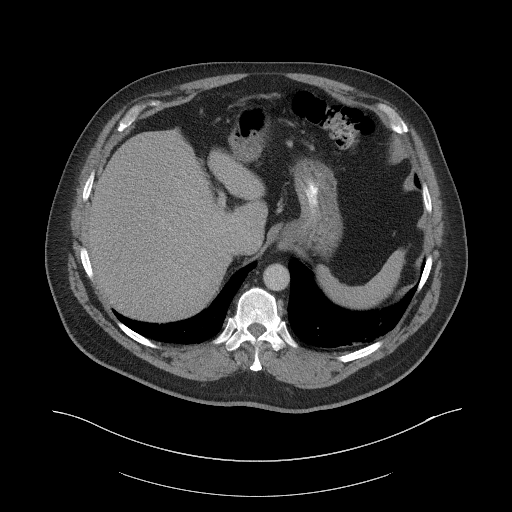
[im 109/116  soft-tissue]
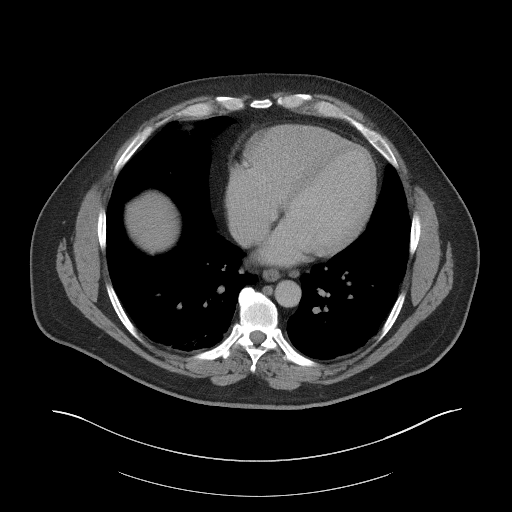

[Series 6: a/p w/ cor · coronal · 0.98mm/px · 3 of 197 slices shown]
[im 66/197  soft-tissue]
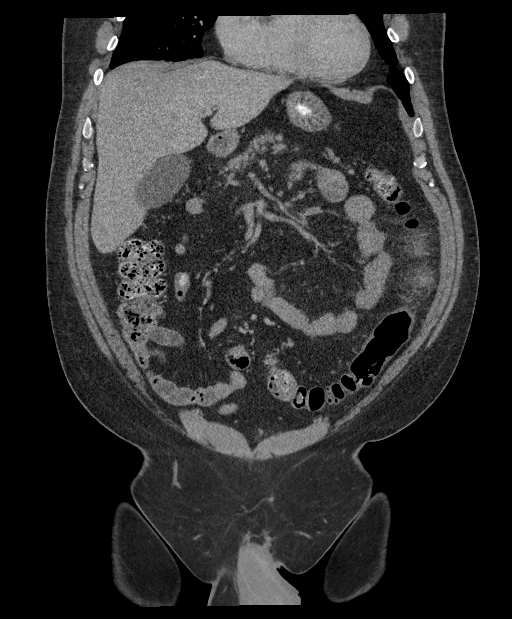
[im 88/197  soft-tissue]
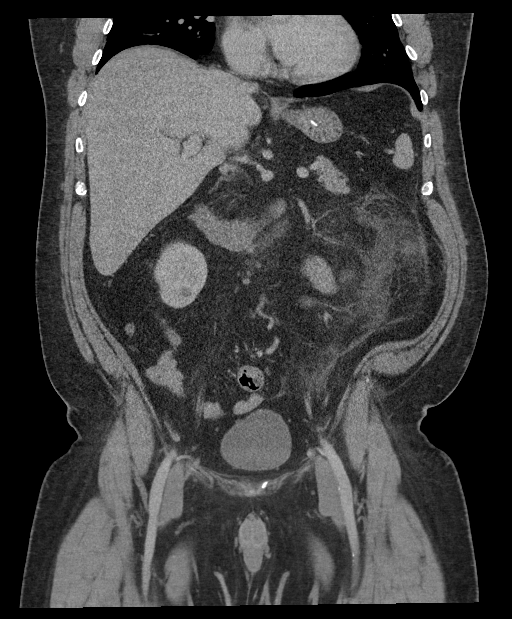
[im 109/197  soft-tissue]
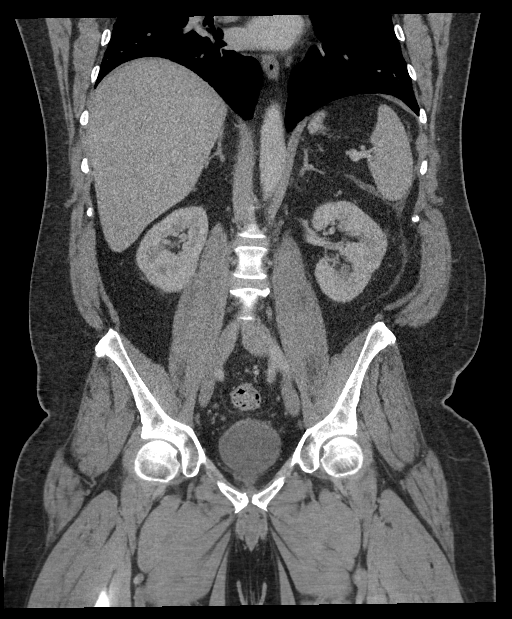

[15 of 46 positions shown; findings below may reference images not displayed]

FINDINGS: Lower chest: There is bibasilar atelectasis.

Hepatobiliary: There is a mass arising in the posterior segment of
the right lobe of the liver measuring 2.5 x 2.8 cm which is
noncystic. No other liver lesions are appreciable on this
noncontrast enhanced study. Gallbladder wall is not appreciably
thickened. There is no biliary duct dilatation.

Pancreas: There is no pancreatic mass or inflammatory focus.

Spleen: No splenic lesions are evident.

Adrenals/Urinary Tract: Right adrenal appears normal. There is a 6
mm adenoma in the superior aspect of the left adrenal. Left adrenal
otherwise appears normal. There is a cyst arising in the upper pole
of the left kidney measuring 2.2 x 2.0 cm. There is a cyst in the
posterior mid left kidney measuring 6 x 6 mm. There is a cyst in the
posterior right kidney measuring 1.1 x 0.8 cm. No evident
hydronephrosis on either side. There is no renal or ureteral
calculus on either side. Urinary bladder is midline with wall
thickness within normal limits.

Stomach/Bowel: There is wall thickening in the distal descending
colon with several diverticula in this area which appear slightly
irregular. There is soft tissue stranding and fluid in this area
with fluid tracking into the lateral conal fascia on the left. No
perforation or abscess seen in this area of apparent diverticulitis.

Elsewhere there is no bowel wall thickening or evidence of bowel
obstruction. The terminal ileum appears normal. There is no free air
or portal venous air.

Vascular/Lymphatic: No abdominal aortic aneurysm. There are a few
vascular calcifications in the aorta. Major venous structures appear
patent. There is no evident adenopathy in the abdomen or pelvis.

Reproductive: Prostate and seminal vesicles are normal in size and
contour. No evident pelvic mass.

Other: The appendix appears normal. No evident abscess or ascites in
the abdomen or pelvis. There is fat in each inguinal ring, more on
the right than on the left. There is slight fat in the umbilicus.

Musculoskeletal: There is degenerative change in the lumbar spine.
No blastic or lytic bone lesions. There is no intramuscular lesion.
IMPRESSION: 1. Apparent diverticulitis involving the mid to distal descending
colon with localized fluid and soft tissue stranding. There is wall
thickening in this area. No abscess or perforation evident.

2. No bowel obstruction. No abscess in the abdomen or pelvis.
Appendix appears normal.

3.  Subcentimeter benign adenoma left adrenal.

4. No renal or ureteral calculi. No hydronephrosis. Urinary bladder
wall thickness normal.

5. Noncystic right lobe liver mass measuring 2.5 x 2.8 cm. Further
evaluation of this lesion with pre and serial post-contrast MR or CT
nonemergently may well be advised to further assess.

6.  Aortic Atherosclerosis ([id number]).
